# Patient Record
Sex: Female | Born: 1990 | Race: White | Hispanic: No | Marital: Married | Smoking: Current every day smoker
Health system: Southern US, Community
[De-identification: ages and names within clinical notes are randomized; demographics above are authoritative.]

## PROBLEM LIST (undated history)

## (undated) DIAGNOSIS — J45909 Unspecified asthma, uncomplicated: Secondary | ICD-10-CM

## (undated) DIAGNOSIS — E119 Type 2 diabetes mellitus without complications: Secondary | ICD-10-CM

## (undated) DIAGNOSIS — F419 Anxiety disorder, unspecified: Secondary | ICD-10-CM

## (undated) DIAGNOSIS — F4310 Post-traumatic stress disorder, unspecified: Secondary | ICD-10-CM

## (undated) HISTORY — PX: DILATION AND CURETTAGE OF UTERUS: SHX78

## (undated) HISTORY — PX: APPENDECTOMY: SHX54

## (undated) HISTORY — PX: OTHER SURGICAL HISTORY: SHX169

---

## 2015-02-22 ENCOUNTER — Emergency Department (HOSPITAL_COMMUNITY)
Admission: EM | Admit: 2015-02-22 | Discharge: 2015-02-22 | Disposition: A | Discharge status: Home / Self Care | Attending: Emergency Medicine | Admitting: Emergency Medicine

## 2015-02-22 ENCOUNTER — Encounter (HOSPITAL_COMMUNITY): Payer: Self-pay | Admitting: Emergency Medicine

## 2015-02-22 DIAGNOSIS — Z880 Allergy status to penicillin: Secondary | ICD-10-CM | POA: Insufficient documentation

## 2015-02-22 DIAGNOSIS — R079 Chest pain, unspecified: Secondary | ICD-10-CM | POA: Diagnosis not present

## 2015-02-22 DIAGNOSIS — Z794 Long term (current) use of insulin: Secondary | ICD-10-CM | POA: Diagnosis not present

## 2015-02-22 DIAGNOSIS — Z79899 Other long term (current) drug therapy: Secondary | ICD-10-CM | POA: Insufficient documentation

## 2015-02-22 DIAGNOSIS — R55 Syncope and collapse: Secondary | ICD-10-CM | POA: Insufficient documentation

## 2015-02-22 DIAGNOSIS — R112 Nausea with vomiting, unspecified: Secondary | ICD-10-CM | POA: Diagnosis not present

## 2015-02-22 DIAGNOSIS — R42 Dizziness and giddiness: Secondary | ICD-10-CM | POA: Diagnosis present

## 2015-02-22 DIAGNOSIS — E119 Type 2 diabetes mellitus without complications: Secondary | ICD-10-CM | POA: Diagnosis not present

## 2015-02-22 DIAGNOSIS — R0602 Shortness of breath: Secondary | ICD-10-CM | POA: Insufficient documentation

## 2015-02-22 HISTORY — DX: Type 2 diabetes mellitus without complications: E11.9

## 2015-02-22 LAB — COMPREHENSIVE METABOLIC PANEL
ALBUMIN: 4.1 g/dL (ref 3.5–5.0)
ALK PHOS: 77 U/L (ref 38–126)
ALT: 17 U/L (ref 14–54)
ANION GAP: 8 (ref 5–15)
AST: 22 U/L (ref 15–41)
BUN: 13 mg/dL (ref 6–20)
CALCIUM: 9.1 mg/dL (ref 8.9–10.3)
CO2: 26 mmol/L (ref 22–32)
CREATININE: 0.66 mg/dL (ref 0.44–1.00)
Chloride: 104 mmol/L (ref 101–111)
GFR calc Af Amer: >60 mL/min (ref >60)
GFR calc non Af Amer: >60 mL/min (ref >60)
Glucose, Bld: 169 mg/dL — ABNORMAL HIGH (ref 65–99)
Potassium: 3.4 mmol/L — ABNORMAL LOW (ref 3.5–5.1)
Sodium: 138 mmol/L (ref 135–145)
Total Bilirubin: 0.5 mg/dL (ref 0.3–1.2)
Total Protein: 7.6 g/dL (ref 6.5–8.1)

## 2015-02-22 LAB — CBC WITH DIFFERENTIAL/PLATELET
BASOS ABS: 0 10*3/uL (ref 0.0–0.1)
Basophils Relative: 0 % (ref 0–1)
Eosinophils Absolute: 0 10*3/uL (ref 0.0–0.7)
Eosinophils Relative: 1 % (ref 0–5)
HCT: 39.4 % (ref 36.0–46.0)
Hemoglobin: 12.3 g/dL (ref 12.0–15.0)
Lymphocytes Relative: 27 % (ref 12–46)
Lymphs Abs: 2.1 10*3/uL (ref 0.7–4.0)
MCH: 24.9 pg — AB (ref 26.0–34.0)
MCHC: 31.2 g/dL (ref 30.0–36.0)
MCV: 79.9 fL (ref 78.0–100.0)
MONOS PCT: 7 % (ref 3–12)
Monocytes Absolute: 0.6 10*3/uL (ref 0.1–1.0)
Neutro Abs: 5.2 10*3/uL (ref 1.7–7.7)
Neutrophils Relative %: 65 % (ref 43–77)
Platelets: 279 10*3/uL (ref 150–400)
RBC: 4.93 MIL/uL (ref 3.87–5.11)
RDW: 13.6 % (ref 11.5–15.5)
WBC: 8 10*3/uL (ref 4.0–10.5)

## 2015-02-22 LAB — RAPID URINE DRUG SCREEN, HOSP PERFORMED
Amphetamines: NOT DETECTED
BARBITURATES: NOT DETECTED
Benzodiazepines: NOT DETECTED
Cocaine: NOT DETECTED
OPIATES: NOT DETECTED
TETRAHYDROCANNABINOL: NOT DETECTED

## 2015-02-22 LAB — URINALYSIS, ROUTINE W REFLEX MICROSCOPIC
BILIRUBIN URINE: NEGATIVE
Glucose, UA: NEGATIVE mg/dL
Hgb urine dipstick: NEGATIVE
KETONES UR: NEGATIVE mg/dL
LEUKOCYTES UA: NEGATIVE
Nitrite: NEGATIVE
PH: 7 (ref 5.0–8.0)
PROTEIN: NEGATIVE mg/dL
Specific Gravity, Urine: 1.014 (ref 1.005–1.030)
UROBILINOGEN UA: 0.2 mg/dL (ref 0.0–1.0)

## 2015-02-22 LAB — CBG MONITORING, ED: GLUCOSE-CAPILLARY: 152 mg/dL — AB (ref 65–99)

## 2015-02-22 LAB — LIPASE, BLOOD: LIPASE: 11 U/L — AB (ref 22–51)

## 2015-02-22 LAB — PREGNANCY, URINE: Preg Test, Ur: NEGATIVE

## 2015-02-22 MED ORDER — SODIUM CHLORIDE 0.9 % IV BOLUS (SEPSIS)
1000.0000 mL | Freq: Once | INTRAVENOUS | Status: AC
  Administered 2015-02-22: 1000 mL via INTRAVENOUS

## 2015-02-22 MED ORDER — ONDANSETRON HCL 4 MG/2ML IJ SOLN
4.0000 mg | Freq: Once | INTRAMUSCULAR | Status: AC
  Administered 2015-02-22: 4 mg via INTRAVENOUS
  Filled 2015-02-22: qty 2

## 2015-02-22 MED ORDER — ONDANSETRON 4 MG PO TBDP: 4.0000 mg | ORAL_TABLET | Freq: Three times a day (TID) | ORAL | Status: DC | PRN

## 2015-02-22 NOTE — ED Notes (Signed)
Pt ambulate in the hallway with no assistance

## 2015-02-22 NOTE — Discharge Instructions (Signed)
Please follow up with your primary care physician in 1-2 days. If you do not have one please [facility] number listed above. Please read all discharge instructions and return precautions.    Near-Syncope Near-syncope (commonly known as near fainting) is sudden weakness, dizziness, or feeling like you might pass out. During an episode of near-syncope, you may also develop pale skin, have tunnel vision, or feel sick to your stomach (nauseous). Near-syncope may occur when getting up after sitting or while standing for a long time. It is caused by a sudden decrease in blood flow to the brain. This decrease can result from various causes or triggers, most of which are not serious. However, because near-syncope can sometimes be a sign of something serious, a medical evaluation is required. The specific cause is often not determined. HOME CARE INSTRUCTIONS  Monitor your condition for any changes. The following actions may help to alleviate any discomfort you are experiencing:  Have someone stay with you until you feel stable.  Lie down right away and prop your feet up if you start feeling like you might faint. Breathe deeply and steadily. Wait until all the symptoms have passed. Most of these episodes last only a few minutes. You may feel tired for several hours.   Drink enough fluids to keep your urine clear or pale yellow.   If you are taking blood pressure or heart medicine, get up slowly when seated or lying down. Take several minutes to sit and then stand. This can reduce dizziness.  Follow up with your health care provider as directed. SEEK IMMEDIATE MEDICAL CARE IF:   You have a severe headache.   You have unusual pain in the chest, abdomen, or back.   You are bleeding from the mouth or rectum, or you have black or tarry stool.   You have an irregular or very fast heartbeat.   You have repeated fainting or have seizure-like jerking during an episode.    You faint when sitting or lying down.   You have confusion.   You have difficulty walking.   You have severe weakness.   You have vision problems.  MAKE SURE YOU:   Understand these instructions.  Will watch your condition.  Will get help right away if you are not doing well or get worse. Document Released: 07/24/2005 Document Revised: 07/29/2013 Document Reviewed: 12/27/2012 [facility] Patient Information 2015 [employer] [place]. This information is not intended to replace advice given to you by your health care provider. Make sure you discuss any questions you have with your health care provider.

## 2015-02-22 NOTE — ED Notes (Addendum)
Pt states that she has had n/v, dizziness and near syncope today. States she is a type 1 diabetic. Sugar at home was 180. Alert and oriented.

## 2015-02-22 NOTE — ED Provider Notes (Signed)
CSN: [id number]     Arrival date & time 02/22/15  0008 History   First MD Initiated Contact with Patient 02/22/15 0010     Chief Complaint  Patient presents with  . Dizziness     (Consider location/radiation/quality/duration/timing/severity/associated sxs/prior Treatment) HPI Comments: Patient is a 24 yo F PMHx significant for DM presenting to the ED for dizziness and lightheadedness that began at 8:30PM this evening. She states she became near syncopal when getting out of the car. She endorses nausea, vomiting x 1 (non-bloody non-bilious), CP and SOB. No modifying factors identified. PERC negative. No cardiac problems.   Patient is a 24 y.o. female presenting with dizziness.  Dizziness Quality:  Lightheadedness, imbalance and head spinning Onset quality:  Sudden Duration:  3 hours Timing:  Constant Chronicity:  New Relieved by:  None tried Worsened by:  Nothing Ineffective treatments:  None tried Associated symptoms: chest pain, nausea, shortness of breath and vomiting   Associated symptoms: no syncope   Risk factors: no heart disease and no hx of stroke     Past Medical History  Diagnosis Date  . Diabetes mellitus    No past surgical history on file. History reviewed. No pertinent family history. History  Substance Use Topics  . Smoking status: Not on file  . Smokeless tobacco: Not on file  . Alcohol Use: Not on file   OB History    No data available     Review of Systems  Respiratory: Positive for shortness of breath.   Cardiovascular: Positive for chest pain. Negative for syncope.  Gastrointestinal: Positive for nausea and vomiting.  Neurological: Positive for dizziness.  All other systems reviewed and are negative.     Allergies  Amoxicillin; Cleocin; Sulfa antibiotics; Vancomycin; and Zithromax  Home Medications   Prior to Admission medications   Medication Sig Start Date End Date Taking? Authorizing Provider  insulin glargine (LANTUS) 100 UNIT/ML  injection Inject 15 Units into the skin 2 (two) times daily.   Yes Historical Provider, MD  insulin lispro (HUMALOG) 100 UNIT/ML injection Inject 5-7 Units into the skin 3 (three) times daily before meals. Sliding scale= base of 5 units and increase by 1 unit for every 50. (150-200=6 units and 200-250=7 units)   Yes Historical Provider, MD  Multiple Vitamin (MULTIVITAMIN WITH MINERALS) TABS tablet Take 1 tablet by mouth daily.   Yes Historical Provider, MD  ondansetron (ZOFRAN ODT) 4 MG disintegrating tablet Take 1 tablet (4 mg total) by mouth every 8 (eight) hours as needed for nausea. 02/22/15   Inza Mikrut, PA-C   BP 94/63 mmHg  Pulse 66  Temp(Src) 98 F (36.7 C) (Oral)  Resp 16  SpO2 100%  LMP 02/01/2015 (Approximate) Physical Exam  Constitutional: She is oriented to person, place, and time. She appears well-developed and well-nourished. No distress.  HENT:  Head: Normocephalic and atraumatic.  Right Ear: External ear normal.  Left Ear: External ear normal.  Nose: Nose normal.  Mouth/Throat: Oropharynx is clear and moist. No oropharyngeal exudate.  Eyes: Conjunctivae and EOM are normal. Pupils are equal, round, and reactive to light.  Neck: Normal range of motion. Neck supple.  Cardiovascular: Normal rate, regular rhythm, normal heart sounds and intact distal pulses.   Pulmonary/Chest: Effort normal and breath sounds normal. No respiratory distress.  Abdominal: Soft. There is no tenderness.  Neurological: She is alert and oriented to person, place, and time. She has normal strength. No cranial nerve deficit. GCS eye subscore is 4. GCS verbal subscore is  5. GCS motor subscore is 6.  Sensation grossly intact.  No pronator drift.  Bilateral heel-knee-shin intact.  Skin: Skin is warm and dry. She is not diaphoretic.  Nursing note and vitals reviewed.   ED Course  Procedures (including critical care time) Medications  sodium chloride 0.9 % bolus 1,000 mL (0 mLs Intravenous  Stopped 02/22/15 0138)  ondansetron (ZOFRAN) injection 4 mg (4 mg Intravenous Given 02/22/15 0038)  sodium chloride 0.9 % bolus 1,000 mL (0 mLs Intravenous Stopped 02/22/15 0308)    Labs Review Labs Reviewed  CBC WITH DIFFERENTIAL/PLATELET - Abnormal; Notable for the following:    MCH 24.9 (*)    All other components within normal limits  COMPREHENSIVE METABOLIC PANEL - Abnormal; Notable for the following:    Potassium 3.4 (*)    Glucose, Bld 169 (*)    All other components within normal limits  LIPASE, BLOOD - Abnormal; Notable for the following:    Lipase 11 (*)    All other components within normal limits  CBG MONITORING, ED - Abnormal; Notable for the following:    Glucose-Capillary 152 (*)    All other components within normal limits  PREGNANCY, URINE  URINALYSIS, ROUTINE W REFLEX MICROSCOPIC (NOT AT [facility])  URINE RAPID DRUG SCREEN, HOSP PERFORMED    Imaging Review No results found.   EKG Interpretation   Date/Time:  Monday February 22 2015 00:31:17 EDT Ventricular Rate:  91 PR Interval:  135 QRS Duration: 96 QT Interval:  379 QTC Calculation: 466 R Axis:   60 Text Interpretation:  Sinus rhythm Normal ECG Confirmed by DELO  MD,  DOUGLAS ([id number]) on 02/22/2015 12:33:57 AM      2:39 AM Patient ambulating without difficulty.   MDM   Final diagnoses:  Near syncope    Filed Vitals:   02/22/15 0309  BP: 94/63  Pulse: 66  Temp:   Resp: 16   Afebrile, NAD, non-toxic appearing, AAOx4.   I have reviewed nursing notes, vital signs, and all lab as noted above.   Patient presenting for multiple complaints this evening. On examination she is uncomfortable appearing. There are no neurofocal deficits on examination. Abdomen is soft, nontender, nondistended. No nuchal rigidity or toxicities to suggest meningitis. We'll obtain basic screening labs and UA. Will give IV fluids and nausea medication. EKG is unremarkable. Labs and UA within normal limits. No evidence of infection.  No orthostasis noted. Patient is able to ambulate in the emergency department after IV fluids with no difficulty. She is symptom-free. Able to tolerate by mouth intake. Will discharge home with good return precautions given.   Francee PiccoloJennifer Narjis Mira, PA-C 02/22/15 [id number]  Geoffery Lyonsouglas Delo, MD 02/22/15 [phone]

## 2015-05-12 ENCOUNTER — Emergency Department (HOSPITAL_COMMUNITY)
Admission: EM | Admit: 2015-05-12 | Discharge: 2015-05-12 | Disposition: A | Discharge status: Home / Self Care | Attending: Emergency Medicine | Admitting: Emergency Medicine

## 2015-05-12 ENCOUNTER — Emergency Department (HOSPITAL_COMMUNITY)

## 2015-05-12 ENCOUNTER — Encounter (HOSPITAL_COMMUNITY): Payer: Self-pay

## 2015-05-12 DIAGNOSIS — E119 Type 2 diabetes mellitus without complications: Secondary | ICD-10-CM | POA: Insufficient documentation

## 2015-05-12 DIAGNOSIS — R739 Hyperglycemia, unspecified: Secondary | ICD-10-CM

## 2015-05-12 DIAGNOSIS — J40 Bronchitis, not specified as acute or chronic: Secondary | ICD-10-CM

## 2015-05-12 DIAGNOSIS — Z8659 Personal history of other mental and behavioral disorders: Secondary | ICD-10-CM | POA: Insufficient documentation

## 2015-05-12 DIAGNOSIS — Z794 Long term (current) use of insulin: Secondary | ICD-10-CM | POA: Insufficient documentation

## 2015-05-12 DIAGNOSIS — J45901 Unspecified asthma with (acute) exacerbation: Secondary | ICD-10-CM | POA: Insufficient documentation

## 2015-05-12 DIAGNOSIS — Z720 Tobacco use: Secondary | ICD-10-CM | POA: Insufficient documentation

## 2015-05-12 DIAGNOSIS — Z880 Allergy status to penicillin: Secondary | ICD-10-CM | POA: Insufficient documentation

## 2015-05-12 DIAGNOSIS — Z79899 Other long term (current) drug therapy: Secondary | ICD-10-CM | POA: Insufficient documentation

## 2015-05-12 DIAGNOSIS — R61 Generalized hyperhidrosis: Secondary | ICD-10-CM | POA: Insufficient documentation

## 2015-05-12 HISTORY — DX: Unspecified asthma, uncomplicated: J45.909

## 2015-05-12 HISTORY — DX: Anxiety disorder, unspecified: F41.9

## 2015-05-12 HISTORY — DX: Post-traumatic stress disorder, unspecified: F43.10

## 2015-05-12 LAB — CBC WITH DIFFERENTIAL/PLATELET
Basophils Absolute: 0 10*3/uL (ref 0.0–0.1)
Basophils Relative: 0 %
Eosinophils Absolute: 0.1 10*3/uL (ref 0.0–0.7)
Eosinophils Relative: 1 %
HEMATOCRIT: 39 % (ref 36.0–46.0)
HEMOGLOBIN: 12.4 g/dL (ref 12.0–15.0)
LYMPHS ABS: 2 10*3/uL (ref 0.7–4.0)
Lymphocytes Relative: 18 %
MCH: 25.3 pg — AB (ref 26.0–34.0)
MCHC: 31.8 g/dL (ref 30.0–36.0)
MCV: 79.6 fL (ref 78.0–100.0)
MONOS PCT: 7 %
Monocytes Absolute: 0.8 10*3/uL (ref 0.1–1.0)
NEUTROS ABS: 8.3 10*3/uL — AB (ref 1.7–7.7)
NEUTROS PCT: 74 %
Platelets: 200 10*3/uL (ref 150–400)
RBC: 4.9 MIL/uL (ref 3.87–5.11)
RDW: 15.1 % (ref 11.5–15.5)
WBC: 11.1 10*3/uL — AB (ref 4.0–10.5)

## 2015-05-12 LAB — BLOOD GAS, VENOUS
ACID-BASE DEFICIT: 4 mmol/L — AB (ref 0.0–2.0)
Bicarbonate: 21.2 mEq/L (ref 20.0–24.0)
O2 SAT: 69.5 %
PCO2 VEN: 41.2 mmHg — AB (ref 45.0–50.0)
PH VEN: 7.331 — AB (ref 7.250–7.300)
PO2 VEN: 42 mmHg (ref 30.0–45.0)
Patient temperature: 98.6
TCO2: 19.6 mmol/L (ref 0–100)

## 2015-05-12 LAB — URINALYSIS, ROUTINE W REFLEX MICROSCOPIC
BILIRUBIN URINE: NEGATIVE
HGB URINE DIPSTICK: NEGATIVE
Ketones, ur: 15 mg/dL — AB
Leukocytes, UA: NEGATIVE
Nitrite: NEGATIVE
Protein, ur: NEGATIVE mg/dL
SPECIFIC GRAVITY, URINE: 1.03 (ref 1.005–1.030)
UROBILINOGEN UA: 1 mg/dL (ref 0.0–1.0)
pH: 6.5 (ref 5.0–8.0)

## 2015-05-12 LAB — BASIC METABOLIC PANEL
ANION GAP: 12 (ref 5–15)
BUN: 17 mg/dL (ref 6–20)
CHLORIDE: 96 mmol/L — AB (ref 101–111)
CO2: 21 mmol/L — AB (ref 22–32)
CREATININE: 0.9 mg/dL (ref 0.44–1.00)
Calcium: 9 mg/dL (ref 8.9–10.3)
GFR calc Af Amer: >60 mL/min (ref >60)
GFR calc non Af Amer: >60 mL/min (ref >60)
Glucose, Bld: 768 mg/dL (ref 65–99)
POTASSIUM: 4.9 mmol/L (ref 3.5–5.1)
SODIUM: 129 mmol/L — AB (ref 135–145)

## 2015-05-12 LAB — CBG MONITORING, ED
GLUCOSE-CAPILLARY: 183 mg/dL — AB (ref 65–99)
Glucose-Capillary: 399 mg/dL — ABNORMAL HIGH (ref 65–99)
Glucose-Capillary: 273 mg/dL — ABNORMAL HIGH (ref 65–99)

## 2015-05-12 LAB — URINE MICROSCOPIC-ADD ON

## 2015-05-12 MED ORDER — SODIUM CHLORIDE 0.9 % IV SOLN: Freq: Once | INTRAVENOUS | Status: AC

## 2015-05-12 MED ORDER — SODIUM CHLORIDE 0.9 % IV SOLN
INTRAVENOUS | Status: DC
  Administered 2015-05-12: 3.4 [IU]/h via INTRAVENOUS
  Filled 2015-05-12: qty 2.5

## 2015-05-12 MED ORDER — SODIUM CHLORIDE 0.9 % IV BOLUS (SEPSIS)
1000.0000 mL | Freq: Once | INTRAVENOUS | Status: AC
  Administered 2015-05-12: 1000 mL via INTRAVENOUS

## 2015-05-12 MED ORDER — BENZONATATE 100 MG PO CAPS: 100.0000 mg | ORAL_CAPSULE | Freq: Three times a day (TID) | ORAL | Status: DC | PRN

## 2015-05-12 MED ORDER — DEXTROSE-NACL 5-0.45 % IV SOLN: INTRAVENOUS | Status: DC

## 2015-05-12 NOTE — ED Notes (Signed)
Glucostabilizer discontinued per verbal order Dr. Criss Alvine.

## 2015-05-12 NOTE — ED Notes (Signed)
Dr. Criss Alvine informed pt's CBG 399.

## 2015-05-12 NOTE — ED Notes (Signed)
Patient states she developed a cough 2 weeks ago. Patient has been taking Dayquil, Nyquil, and Mucinex with very little relief. Patient presents today with expiratory wheezing, a productive cough with clear sputum, and SOB. Patient has a history of asthma

## 2015-05-12 NOTE — ED Notes (Signed)
Observed patient injecting Lantus insulin through her jeans. When questioned about this, patient stated, "yeah, I know."

## 2015-05-12 NOTE — Progress Notes (Signed)
CM spoke with pt who confirms uninsured [employer] resident with no pcp. Pt with 2 ED visits in the last 6 months no admissions CM discussed and provided written information for uninsured accepting pcps, discussed the importance of pcp vs EDP services for f/u care, www.needymeds.org, www.goodrx.com, discounted pharmacies and other [employer] such as [facility] , [employer], affordable care act, financial assistance, uninsured dental services, [place] med assist, DSS and  health department  Reviewed resources for [employer] uninsured accepting pcps like [facility], family medicine at [facility], [facility], [facility], local urgent care centers, [facility], [facility] family practice, general medical clinics, family services of the [place], [facility] urgent care plus others, medication resources, [facility] out patient pharmacies and housing Pt voiced understanding and appreciation of resources provided   Provided [facility] contact information Pt did not agreed to a referral States she is not sure if she will have coverage soon and wants to contact them herself if needed

## 2015-05-12 NOTE — ED Provider Notes (Signed)
CSN: [id number]     Arrival date & time 05/12/15  7829 History   First MD Initiated Contact with Patient 05/12/15 1003     Chief Complaint  Patient presents with  . Cough  . Shortness of Breath  . Wheezing     (Consider location/radiation/quality/duration/timing/severity/associated sxs/prior Treatment) HPI  24 year old female presents with a cough that has been ongoing for the past 2 weeks. Patient has tried DayQuil, NyQuil, and Mucinex, minimal to no relief. Patient states that over the last 5 days the cough is been worse and has clear sputum. Intermittent shortness of breath. Patient has been hearing expiratory wheezes as well. Has a history of type 1 diabetes. Used to have asthma as a child, no history of asthma in over 10 years.. She does smoke. Has been feeling chest tightness and occasionally pressure.  Past Medical History  Diagnosis Date  . Diabetes mellitus (HCC)   . Asthma   . Anxiety   . PTSD (post-traumatic stress disorder)    Past Surgical History  Procedure Laterality Date  . Cesarean section      x 2.  . Appendectomy    . Dilation and curettage of uterus     Family History  Problem Relation Age of Onset  . Hypertension Mother   . GER disease Father   . Asthma Brother    Social History  Substance Use Topics  . Smoking status: Current Every Day Smoker -- 0.15 packs/day for 8 years    Types: Cigarettes  . Smokeless tobacco: Never Used  . Alcohol Use: Yes     Comment: occasionally   OB History    No data available     Review of Systems  Constitutional: Positive for diaphoresis. Negative for fever.  HENT: Positive for rhinorrhea.   Respiratory: Positive for cough, chest tightness, shortness of breath and wheezing.   Cardiovascular: Positive for chest pain.  Gastrointestinal: Negative for vomiting.  All other systems reviewed and are negative.     Allergies  Amoxicillin; Cleocin; Sulfa antibiotics; Vancomycin; and Zithromax  Home Medications    Prior to Admission medications   Medication Sig Start Date End Date Taking? Authorizing Provider  Caffeine-Magnesium Salicylate (DIUREX PO) Take 1 capsule by mouth daily as needed (for retaining water).   Yes Historical Provider, MD  DTaP-hepatitis B recombinant-IPV (PEDIARIX) injection Inject 0.5 mLs into the muscle once.   Yes Historical Provider, MD  ferrous sulfate 325 (65 FE) MG tablet Take 325 mg by mouth daily with breakfast.   Yes Historical Provider, MD  insulin glargine (LANTUS) 100 UNIT/ML injection Inject 15 Units into the skin 2 (two) times daily.   Yes Historical Provider, MD  insulin lispro (HUMALOG) 100 UNIT/ML injection Inject 5-7 Units into the skin 3 (three) times daily before meals. Sliding scale= base of 5 units and increase by 1 unit for every 50. (150-200=6 units and 200-250=7 units)   Yes Historical Provider, MD  Multiple Vitamin (MULTIVITAMIN WITH MINERALS) TABS tablet Take 1 tablet by mouth daily.   Yes Historical Provider, MD  ondansetron (ZOFRAN-ODT) 8 MG disintegrating tablet Take 8 mg by mouth every 8 (eight) hours as needed for nausea or vomiting.   Yes Historical Provider, MD  pyridOXINE (B-6) 50 MG tablet Take 50 mg by mouth daily.   Yes Historical Provider, MD  simethicone (MYLICON) 80 MG chewable tablet Chew 80 mg by mouth 3 (three) times daily as needed for flatulence.   Yes Historical Provider, MD  vitamin C (ASCORBIC ACID) 500  MG tablet Take 500 mg by mouth daily.   Yes Historical Provider, MD  ondansetron (ZOFRAN ODT) 4 MG disintegrating tablet Take 1 tablet (4 mg total) by mouth every 8 (eight) hours as needed for nausea. Patient not taking: Reported on 05/12/2015 02/22/15   Francee Piccolo, PA-C   BP 112/77 mmHg  Pulse 112  Temp(Src) 98.1 F (36.7 C) (Oral)  Resp 16  Ht  (1.549 m)  Wt 130 lb (58.968 kg)  BMI 24.58 kg/m2  SpO2 98%  LMP 05/05/2015 Physical Exam  Constitutional: She is oriented to person, place, and time. She appears  well-developed and well-nourished. No distress.  HENT:  Head: Normocephalic and atraumatic.  Right Ear: External ear normal.  Left Ear: External ear normal.  Nose: Nose normal.  Eyes: Right eye exhibits no discharge. Left eye exhibits no discharge.  Cardiovascular: Normal rate, regular rhythm and normal heart sounds.   HR high 90s  Pulmonary/Chest: Effort normal and breath sounds normal. She has no wheezes. She has no rales.  Abdominal: Soft. She exhibits no distension. There is no tenderness.  Neurological: She is alert and oriented to person, place, and time.  Skin: Skin is warm and dry. She is not diaphoretic.  Nursing note and vitals reviewed.   ED Course  Procedures (including critical care time) Labs Review Labs Reviewed  BASIC METABOLIC PANEL - Abnormal; Notable for the following:    Sodium 129 (*)    Chloride 96 (*)    CO2 21 (*)    Glucose, Bld 768 (*)    All other components within normal limits  CBC WITH DIFFERENTIAL/PLATELET - Abnormal; Notable for the following:    WBC 11.1 (*)    MCH 25.3 (*)    Neutro Abs 8.3 (*)    All other components within normal limits  URINALYSIS, ROUTINE W REFLEX MICROSCOPIC (NOT AT [facility]) - Abnormal; Notable for the following:    Glucose, UA >1000 (*)    Ketones, ur 15 (*)    All other components within normal limits  BLOOD GAS, VENOUS - Abnormal; Notable for the following:    pH, Ven 7.331 (*)    pCO2, Ven 41.2 (*)    Acid-base deficit 4.0 (*)    All other components within normal limits  CBG MONITORING, ED - Abnormal; Notable for the following:    Glucose-Capillary 399 (*)    All other components within normal limits  CBG MONITORING, ED - Abnormal; Notable for the following:    Glucose-Capillary 273 (*)    All other components within normal limits  URINE MICROSCOPIC-ADD ON  CBG MONITORING, ED    Imaging Review Dg Chest 2 View  05/12/2015   CLINICAL DATA:  Cough  EXAM: CHEST  2 VIEW  COMPARISON:  None.  FINDINGS: The heart  size and mediastinal contours are within normal limits. Both lungs are clear. The visualized skeletal structures are unremarkable.  IMPRESSION: No active cardiopulmonary disease.   Electronically Signed   By: Marlan Palau M.D.   On: 05/12/2015 12:00   I have personally reviewed and evaluated these images and lab results as part of my medical decision-making.   EKG Interpretation   Date/Time:  Wednesday May 12 2015 10:43:59 EDT Ventricular Rate:  84 PR Interval:  133 QRS Duration: 101 QT Interval:  369 QTC Calculation: 436 R Axis:   75 Text Interpretation:  Sinus rhythm Low voltage, precordial leads RSR' in  V1 or V2, right VCD or RVH Baseline wander in lead(s) I  III aVL no  significant change since July 2016 Confirmed by Criss Alvine  MD, Nicolae Vasek [phone])  on 05/12/2015 10:50:19 AM      MDM   Final diagnoses:  Bronchitis  Hyperglycemia    Given symptoms she most likely has bronchitis/viral URI. Could be flu but out of the window for tamiflu treatment given length of symptoms. No recent history of RAD, and with significant hyperglycemia I think steroids would be more harmful than beneficial. Given fluids and insulin to help with her glucose. No definitive signs of DKA (normal AG, no significant acidosis).  Discussed strict return precautions.    Pricilla Loveless, MD 05/12/15 [phone]

## 2015-05-27 ENCOUNTER — Emergency Department (HOSPITAL_COMMUNITY)
Admission: EM | Admit: 2015-05-27 | Discharge: 2015-05-27 | Discharge status: Against Medical Advice | Attending: Emergency Medicine | Admitting: Emergency Medicine

## 2015-05-27 ENCOUNTER — Encounter (HOSPITAL_COMMUNITY): Payer: Self-pay | Admitting: Emergency Medicine

## 2015-05-27 DIAGNOSIS — E119 Type 2 diabetes mellitus without complications: Secondary | ICD-10-CM | POA: Insufficient documentation

## 2015-05-27 DIAGNOSIS — Y998 Other external cause status: Secondary | ICD-10-CM | POA: Insufficient documentation

## 2015-05-27 DIAGNOSIS — J45909 Unspecified asthma, uncomplicated: Secondary | ICD-10-CM | POA: Insufficient documentation

## 2015-05-27 DIAGNOSIS — T189XXA Foreign body of alimentary tract, part unspecified, initial encounter: Secondary | ICD-10-CM | POA: Insufficient documentation

## 2015-05-27 DIAGNOSIS — Z720 Tobacco use: Secondary | ICD-10-CM | POA: Insufficient documentation

## 2015-05-27 DIAGNOSIS — X58XXXA Exposure to other specified factors, initial encounter: Secondary | ICD-10-CM | POA: Insufficient documentation

## 2015-05-27 DIAGNOSIS — Y9289 Other specified places as the place of occurrence of the external cause: Secondary | ICD-10-CM | POA: Insufficient documentation

## 2015-05-27 DIAGNOSIS — Y9389 Activity, other specified: Secondary | ICD-10-CM | POA: Insufficient documentation

## 2015-05-27 NOTE — ED Notes (Signed)
Pt told Registration that she wanted to leave.

## 2015-05-27 NOTE — ED Notes (Signed)
Per pt, states she inhaled cap of pen-now coughed it up but having throat pain

## 2015-11-17 ENCOUNTER — Encounter (HOSPITAL_COMMUNITY): Payer: Self-pay | Admitting: Emergency Medicine

## 2015-11-17 ENCOUNTER — Emergency Department (HOSPITAL_COMMUNITY): Payer: Medicaid Other

## 2015-11-17 ENCOUNTER — Emergency Department (HOSPITAL_COMMUNITY)
Admission: EM | Admit: 2015-11-17 | Discharge: 2015-11-17 | Disposition: A | Discharge status: Home / Self Care | Payer: Medicaid Other | Attending: Emergency Medicine | Admitting: Emergency Medicine

## 2015-11-17 DIAGNOSIS — Z8659 Personal history of other mental and behavioral disorders: Secondary | ICD-10-CM | POA: Diagnosis not present

## 2015-11-17 DIAGNOSIS — J45909 Unspecified asthma, uncomplicated: Secondary | ICD-10-CM | POA: Insufficient documentation

## 2015-11-17 DIAGNOSIS — Z79899 Other long term (current) drug therapy: Secondary | ICD-10-CM | POA: Diagnosis not present

## 2015-11-17 DIAGNOSIS — M791 Myalgia: Secondary | ICD-10-CM | POA: Insufficient documentation

## 2015-11-17 DIAGNOSIS — R059 Cough, unspecified: Secondary | ICD-10-CM

## 2015-11-17 DIAGNOSIS — Z880 Allergy status to penicillin: Secondary | ICD-10-CM | POA: Diagnosis not present

## 2015-11-17 DIAGNOSIS — Z794 Long term (current) use of insulin: Secondary | ICD-10-CM | POA: Diagnosis not present

## 2015-11-17 DIAGNOSIS — F17210 Nicotine dependence, cigarettes, uncomplicated: Secondary | ICD-10-CM | POA: Insufficient documentation

## 2015-11-17 DIAGNOSIS — R05 Cough: Secondary | ICD-10-CM

## 2015-11-17 DIAGNOSIS — R110 Nausea: Secondary | ICD-10-CM | POA: Insufficient documentation

## 2015-11-17 DIAGNOSIS — E119 Type 2 diabetes mellitus without complications: Secondary | ICD-10-CM | POA: Diagnosis not present

## 2015-11-17 MED ORDER — BENZONATATE 100 MG PO CAPS: 100.0000 mg | ORAL_CAPSULE | Freq: Three times a day (TID) | ORAL | Status: DC

## 2015-11-17 MED ORDER — CETIRIZINE HCL 10 MG PO TABS: 10.0000 mg | ORAL_TABLET | Freq: Every day | ORAL | Status: DC

## 2015-11-17 NOTE — Discharge Instructions (Signed)
Take zyrtec for allergies.  Take tessalon to help with cough.  Neither of these should make you drowsy/sleepy. Follow-up with your primary care physician. Return to the ED for new or worsening symptoms.

## 2015-11-17 NOTE — ED Provider Notes (Signed)
CSN: [id number]     Arrival date & time 11/17/15  1115 History   First MD Initiated Contact with Patient 11/17/15 1120     Chief Complaint  Patient presents with  . flu like symptoms      (Consider location/radiation/quality/duration/timing/severity/associated sxs/prior Treatment) The history is provided by the patient and medical records.    25 year old female with history of diabetes, asthma, anxiety, PTSD, presenting to the ED for cough for the past 3 weeks. She states cough has been intermittently productive with frothy, white sputum, which she thought was due to her allergies.  She states she hadn't been running a fever until last night when she spiked a temp of 100.33F but went back to normal this morning without any medications.  She also now has some body aches.  She states her girlfriend was recently sick with similar symptoms, unsure if she pick this up from her. She is also had some sick contacts at school. States she has had some mild nausea and has not felt like eating or drinking very much recently. Patient is a type I diabetic, checked her sugar this morning it was 100. Her diabetes is well controlled on her home medications.  She did take some motrin this morning for bodyaches but states she still feels "sore".  No chest pain, SOB, sore throat, ear pain, vomiting, diarrhea, abdominal pain, or urinary symptoms.  Past Medical History  Diagnosis Date  . Diabetes mellitus (HCC)   . Asthma   . Anxiety   . PTSD (post-traumatic stress disorder)    Past Surgical History  Procedure Laterality Date  . Cesarean section      x 2.  . Appendectomy    . Dilation and curettage of uterus     Family History  Problem Relation Age of Onset  . Hypertension Mother   . GER disease Father   . Asthma Brother    Social History  Substance Use Topics  . Smoking status: Current Every Day Smoker -- 0.15 packs/day for 8 years    Types: Cigarettes  . Smokeless tobacco: Never Used  . Alcohol  Use: Yes     Comment: occasionally   OB History    No data available     Review of Systems  Respiratory: Positive for cough.   Musculoskeletal: Positive for myalgias.  All other systems reviewed and are negative.     Allergies  Amoxicillin; Cleocin; Sulfa antibiotics; Vancomycin; and Zithromax  Home Medications   Prior to Admission medications   Medication Sig Start Date End Date Taking? Authorizing Provider  benzonatate (TESSALON PERLES) 100 MG capsule Take 1 capsule (100 mg total) by mouth 3 (three) times daily as needed for cough. 05/12/15   Pricilla Loveless, MD  Caffeine-Magnesium Salicylate (DIUREX PO) Take 1 capsule by mouth daily as needed (for retaining water).    Historical Provider, MD  DTaP-hepatitis B recombinant-IPV (PEDIARIX) injection Inject 0.5 mLs into the muscle once.    Historical Provider, MD  ferrous sulfate 325 (65 FE) MG tablet Take 325 mg by mouth daily with breakfast.    Historical Provider, MD  insulin glargine (LANTUS) 100 UNIT/ML injection Inject 15 Units into the skin 2 (two) times daily.    Historical Provider, MD  insulin lispro (HUMALOG) 100 UNIT/ML injection Inject 5-7 Units into the skin 3 (three) times daily before meals. Sliding scale= base of 5 units and increase by 1 unit for every 50. (150-200=6 units and 200-250=7 units)    Historical Provider, MD  Multiple Vitamin (MULTIVITAMIN WITH MINERALS) TABS tablet Take 1 tablet by mouth daily.    Historical Provider, MD  ondansetron (ZOFRAN ODT) 4 MG disintegrating tablet Take 1 tablet (4 mg total) by mouth every 8 (eight) hours as needed for nausea. Patient not taking: Reported on 05/12/2015 02/22/15   Francee PiccoloJennifer Piepenbrink, PA-C  ondansetron (ZOFRAN-ODT) 8 MG disintegrating tablet Take 8 mg by mouth every 8 (eight) hours as needed for nausea or vomiting.    Historical Provider, MD  pyridOXINE (B-6) 50 MG tablet Take 50 mg by mouth daily.    Historical Provider, MD  simethicone (MYLICON) 80 MG chewable  tablet Chew 80 mg by mouth 3 (three) times daily as needed for flatulence.    Historical Provider, MD  vitamin C (ASCORBIC ACID) 500 MG tablet Take 500 mg by mouth daily.    Historical Provider, MD   BP 105/79 mmHg  Pulse 95  Temp(Src) 98.6 F (37 C) (Oral)  Resp 16  SpO2 100%  LMP 10/24/2015   Physical Exam  Constitutional: She is oriented to person, place, and time. She appears well-developed and well-nourished.  Well appearing, no distress  HENT:  Head: Normocephalic and atraumatic.  Right Ear: Tympanic membrane and ear canal normal.  Left Ear: Tympanic membrane and ear canal normal.  Nose: Nose normal.  Mouth/Throat: Uvula is midline, oropharynx is clear and moist and mucous membranes are normal.  Tonsils overall normal in appearance bilaterally without exudate; uvula midline without evidence of peritonsillar abscess; handling secretions appropriately; no difficulty swallowing or speaking; normal phonation without stridor  Eyes: Conjunctivae and EOM are normal. Pupils are equal, round, and reactive to light.  Neck: Normal range of motion.  Cardiovascular: Normal rate, regular rhythm and normal heart sounds.   Pulmonary/Chest: Effort normal and breath sounds normal. No respiratory distress. She has no wheezes. She has no rhonchi. She has no rales.  No cough noted during exam, no wheezes or rhonchi, no distress  Abdominal: Soft. Bowel sounds are normal.  Musculoskeletal: Normal range of motion.  Lymphadenopathy:    She has no cervical adenopathy.  Neurological: She is alert and oriented to person, place, and time.  Skin: Skin is warm and dry.  Psychiatric: She has a normal mood and affect.  Nursing note and vitals reviewed.   ED Course  Procedures (including critical care time) Labs Review Labs Reviewed - No data to display  Imaging Review Dg Chest 2 View  11/17/2015  CLINICAL DATA:  Cough for 3-4 weeks. Fever. Lightheadedness, dizziness, and blurred vision. Smoker. EXAM:  CHEST  2 VIEW COMPARISON:  05/12/2015. FINDINGS: The heart size and mediastinal contours are within normal limits. Both lungs are clear. The visualized skeletal structures are unremarkable. IMPRESSION: No active cardiopulmonary disease.  No change from priors. Electronically Signed   By: Elsie StainJohn T Curnes M.D.   On: 11/17/2015 13:04   I have personally reviewed and evaluated these images and lab results as part of my medical decision-making.   EKG Interpretation None      MDM   Final diagnoses:  Cough   25 year old female here with intermittently productive cough for the past 3 weeks. Reports onset of low-grade fever and body aches yesterday. Patient is afebrile, nontoxic. Her exam is overall benign. She has had no active cough here in the emergency department. She has no wheezes or rhonchi. Does report sick contacts with similar symptoms. Chest x-ray was obtained given prolonged cough, no evidence of acute infiltrate or other cardiopulmonary disease. Patient discharged home  with supportive care. She does report history of allergies, recommended that she start a daily Zyrtec. Also given Tessalon for cough.  Discussed plan with patient, he/she acknowledged understanding and agreed with plan of care.  Return precautions given for new or worsening symptoms.  Garlon Hatchet, PA-C 11/17/15 1329  Tilden Fossa, MD 11/18/15 [phone]

## 2015-11-17 NOTE — ED Notes (Signed)
Per pt, states flu like symptoms for 3 weeks-cough, fever, and body aches

## 2015-12-08 ENCOUNTER — Emergency Department (HOSPITAL_COMMUNITY)
Admission: EM | Admit: 2015-12-08 | Discharge: 2015-12-08 | Disposition: A | Discharge status: Home / Self Care | Payer: Medicaid Other | Attending: Emergency Medicine | Admitting: Emergency Medicine

## 2015-12-08 ENCOUNTER — Encounter (HOSPITAL_COMMUNITY): Payer: Self-pay | Admitting: *Deleted

## 2015-12-08 ENCOUNTER — Emergency Department (HOSPITAL_COMMUNITY): Payer: Medicaid Other

## 2015-12-08 DIAGNOSIS — Z8659 Personal history of other mental and behavioral disorders: Secondary | ICD-10-CM | POA: Insufficient documentation

## 2015-12-08 DIAGNOSIS — Z794 Long term (current) use of insulin: Secondary | ICD-10-CM | POA: Insufficient documentation

## 2015-12-08 DIAGNOSIS — F17210 Nicotine dependence, cigarettes, uncomplicated: Secondary | ICD-10-CM | POA: Insufficient documentation

## 2015-12-08 DIAGNOSIS — R112 Nausea with vomiting, unspecified: Secondary | ICD-10-CM | POA: Diagnosis not present

## 2015-12-08 DIAGNOSIS — Z880 Allergy status to penicillin: Secondary | ICD-10-CM | POA: Diagnosis not present

## 2015-12-08 DIAGNOSIS — R197 Diarrhea, unspecified: Secondary | ICD-10-CM | POA: Diagnosis not present

## 2015-12-08 DIAGNOSIS — J45909 Unspecified asthma, uncomplicated: Secondary | ICD-10-CM | POA: Diagnosis not present

## 2015-12-08 DIAGNOSIS — Z79899 Other long term (current) drug therapy: Secondary | ICD-10-CM | POA: Insufficient documentation

## 2015-12-08 DIAGNOSIS — E1065 Type 1 diabetes mellitus with hyperglycemia: Secondary | ICD-10-CM | POA: Diagnosis not present

## 2015-12-08 DIAGNOSIS — R739 Hyperglycemia, unspecified: Secondary | ICD-10-CM

## 2015-12-08 LAB — URINE MICROSCOPIC-ADD ON
Bacteria, UA: NONE SEEN
RBC / HPF: NONE SEEN RBC/hpf (ref 0–5)
WBC, UA: NONE SEEN WBC/hpf (ref 0–5)

## 2015-12-08 LAB — COMPREHENSIVE METABOLIC PANEL
ALBUMIN: 4.7 g/dL (ref 3.5–5.0)
ALT: 15 U/L (ref 14–54)
AST: 21 U/L (ref 15–41)
Alkaline Phosphatase: 89 U/L (ref 38–126)
Anion gap: 14 (ref 5–15)
BUN: 11 mg/dL (ref 6–20)
CHLORIDE: 102 mmol/L (ref 101–111)
CO2: 20 mmol/L — AB (ref 22–32)
CREATININE: 0.72 mg/dL (ref 0.44–1.00)
Calcium: 9.3 mg/dL (ref 8.9–10.3)
GFR calc non Af Amer: >60 mL/min (ref >60)
GLUCOSE: 400 mg/dL — AB (ref 65–99)
Potassium: 3.6 mmol/L (ref 3.5–5.1)
SODIUM: 136 mmol/L (ref 135–145)
Total Bilirubin: 0.5 mg/dL (ref 0.3–1.2)
Total Protein: 8.3 g/dL — ABNORMAL HIGH (ref 6.5–8.1)

## 2015-12-08 LAB — BLOOD GAS, VENOUS
ACID-BASE DEFICIT: 3.5 mmol/L — AB (ref 0.0–2.0)
BICARBONATE: 20.7 meq/L (ref 20.0–24.0)
O2 SAT: 89.1 %
PO2 VEN: 57.5 mmHg — AB (ref 31.0–45.0)
Patient temperature: 98.6
TCO2: 19.1 mmol/L (ref 0–100)
pCO2, Ven: 36.4 mmHg — ABNORMAL LOW (ref 45.0–50.0)
pH, Ven: 7.373 — ABNORMAL HIGH (ref 7.250–7.300)

## 2015-12-08 LAB — URINALYSIS, ROUTINE W REFLEX MICROSCOPIC
BILIRUBIN URINE: NEGATIVE
Glucose, UA: >1000 mg/dL — AB
Hgb urine dipstick: NEGATIVE
Ketones, ur: NEGATIVE mg/dL
LEUKOCYTES UA: NEGATIVE
NITRITE: NEGATIVE
PH: 5.5 (ref 5.0–8.0)
Protein, ur: NEGATIVE mg/dL
SPECIFIC GRAVITY, URINE: 1.009 (ref 1.005–1.030)

## 2015-12-08 LAB — BASIC METABOLIC PANEL
Anion gap: 8 (ref 5–15)
BUN: 8 mg/dL (ref 6–20)
CO2: 24 mmol/L (ref 22–32)
Calcium: 8.7 mg/dL — ABNORMAL LOW (ref 8.9–10.3)
Chloride: 107 mmol/L (ref 101–111)
Creatinine, Ser: 0.5 mg/dL (ref 0.44–1.00)
GFR calc Af Amer: >60 mL/min (ref >60)
GFR calc non Af Amer: >60 mL/min (ref >60)
Glucose, Bld: 86 mg/dL (ref 65–99)
Potassium: 3.5 mmol/L (ref 3.5–5.1)
Sodium: 139 mmol/L (ref 135–145)

## 2015-12-08 LAB — CBC
HEMATOCRIT: 40.4 % (ref 36.0–46.0)
HEMOGLOBIN: 13.2 g/dL (ref 12.0–15.0)
MCH: 25.6 pg — ABNORMAL LOW (ref 26.0–34.0)
MCHC: 32.7 g/dL (ref 30.0–36.0)
MCV: 78.3 fL (ref 78.0–100.0)
Platelets: 287 10*3/uL (ref 150–400)
RBC: 5.16 MIL/uL — AB (ref 3.87–5.11)
RDW: 13.4 % (ref 11.5–15.5)
WBC: 10.7 10*3/uL — AB (ref 4.0–10.5)

## 2015-12-08 LAB — CBG MONITORING, ED
GLUCOSE-CAPILLARY: 385 mg/dL — AB (ref 65–99)
GLUCOSE-CAPILLARY: 89 mg/dL (ref 65–99)

## 2015-12-08 LAB — LIPASE, BLOOD: Lipase: 37 U/L (ref 11–51)

## 2015-12-08 MED ORDER — INSULIN GLARGINE 100 UNIT/ML ~~LOC~~ SOLN: 15.0000 [IU] | Freq: Two times a day (BID) | SUBCUTANEOUS | Status: DC

## 2015-12-08 MED ORDER — ONDANSETRON 4 MG PO TBDP
4.0000 mg | ORAL_TABLET | Freq: Once | ORAL | Status: AC | PRN
  Administered 2015-12-08: 4 mg via ORAL
  Filled 2015-12-08: qty 1

## 2015-12-08 MED ORDER — SODIUM CHLORIDE 0.9 % IV BOLUS (SEPSIS)
1000.0000 mL | Freq: Once | INTRAVENOUS | Status: AC
  Administered 2015-12-08: 1000 mL via INTRAVENOUS

## 2015-12-08 MED ORDER — INSULIN ASPART 100 UNIT/ML ~~LOC~~ SOLN
5.0000 [IU] | Freq: Once | SUBCUTANEOUS | Status: AC
  Administered 2015-12-08: 5 [IU] via INTRAVENOUS
  Filled 2015-12-08: qty 1

## 2015-12-08 MED ORDER — INSULIN LISPRO 100 UNIT/ML ~~LOC~~ SOLN: SUBCUTANEOUS | Status: AC

## 2015-12-08 MED ORDER — GLUCOSE BLOOD VI STRP: ORAL_STRIP | Status: AC

## 2015-12-08 NOTE — ED Provider Notes (Signed)
CSN: [id number]     Arrival date & time 12/08/15  1329 History   First MD Initiated Contact with Patient 12/08/15 1433     Chief Complaint  Patient presents with  . Hyperglycemia  . Emesis     (Consider location/radiation/quality/duration/timing/severity/associated sxs/prior Treatment) Patient is a 25 y.o. female presenting with hyperglycemia and vomiting.  Hyperglycemia Blood sugar level PTA:  Not registering with home glucose monitor Severity:  Severe Onset quality:  Sudden Duration:  5 hours Timing:  Constant Progression:  Unchanged Diabetes status:  Controlled with insulin Current diabetic therapy:  Humalog sliding scale, base 5U BLD and Lantus 15U BID Time since last antidiabetic medication:  3 hours Context: recent illness   Context: not change in medication, not new diabetes diagnosis, not noncompliance and not recent change in diet   Relieved by:  Nothing Ineffective treatments:  Insulin Associated symptoms: dehydration, increased thirst, nausea, polyuria and vomiting   Associated symptoms: no abdominal pain, no chest pain, no dysuria, no fever, no increased appetite and no shortness of breath   Risk factors: hx of DKA   Emesis Number of daily episodes:  5-10 Quality:  Stomach contents Associated symptoms: diarrhea   Associated symptoms: no abdominal pain    Molly Benson is a 25 y.o. female with PMH significant for DMT1, asthma, anxiety, PTSD who presents with hyperglycemia.  Patient reports she checked her blood sugar at 9 AM this morning, and it would not register.  She then took 12U insulin.  She rechecked it around 10-11 AM and it was still not registering.  Around that time she began experiencing NBNB emesis and feeling dehydrated.  She then took 5U insulin around 12 PM and does not report improvement.  Associated symptoms include cough, polyuria, loss of appetite, and diarrhea.  Denies fever, chills, abdominal pain, CP, SOB, or urinary symptoms.  She has not had any  changes to her DM regimen or any new medications.  She takes Humalog on a sliding scale with a base of 5U at B,L,D and Lantus 15U BID.  She denies missed doses.  She does report that she has been under a lot of stress recently.  She states she was seen a couple of weeks ago for cough, but states that her cough has not gotten any better.  No hemoptysis, unilateral leg swelling, hx of DVT/PE, recent trauma/surgery, or exogenous estrogen.  Past Medical History  Diagnosis Date  . Diabetes mellitus (HCC)   . Asthma   . Anxiety   . PTSD (post-traumatic stress disorder)    Past Surgical History  Procedure Laterality Date  . Cesarean section      x 2.  . Appendectomy    . Dilation and curettage of uterus     Family History  Problem Relation Age of Onset  . Hypertension Mother   . GER disease Father   . Asthma Brother    Social History  Substance Use Topics  . Smoking status: Current Every Day Smoker -- 0.15 packs/day for 8 years    Types: Cigarettes  . Smokeless tobacco: Never Used  . Alcohol Use: Yes     Comment: occasionally   OB History    No data available     Review of Systems  Constitutional: Negative for fever.  Respiratory: Negative for shortness of breath.   Cardiovascular: Negative for chest pain.  Gastrointestinal: Positive for nausea, vomiting and diarrhea. Negative for abdominal pain.  Endocrine: Positive for polydipsia and polyuria. Negative for polyphagia.  Genitourinary:  Negative for dysuria, urgency and hematuria.  All other systems reviewed and are negative.     Allergies  Amoxicillin; Cleocin; Sulfa antibiotics; Vancomycin; and Zithromax  Home Medications   Prior to Admission medications   Medication Sig Start Date End Date Taking? Authorizing Provider  acetaminophen (TYLENOL) 500 MG tablet Take 1,000 mg by mouth every 6 (six) hours as needed for moderate pain or headache.   Yes Historical Provider, MD  bismuth subsalicylate (PEPTO BISMOL) 262 MG/15ML  suspension Take 30 mLs by mouth every 6 (six) hours as needed for indigestion.   Yes Historical Provider, MD  Caffeine-Magnesium Salicylate (DIUREX PO) Take 1 capsule by mouth daily as needed (for retaining water).   Yes Historical Provider, MD  cetirizine (ZYRTEC ALLERGY) 10 MG tablet Take 1 tablet (10 mg total) by mouth daily. Patient taking differently: Take 10 mg by mouth daily as needed for allergies.  11/17/15  Yes Garlon HatchetLisa M Sanders, PA-C  diphenhydrAMINE (BENADRYL) 25 MG tablet Take 25 mg by mouth every 6 (six) hours as needed for itching, allergies or sleep.   Yes Historical Provider, MD  ferrous sulfate 325 (65 FE) MG tablet Take 325 mg by mouth daily as needed (menstrual bleeding).    Yes Historical Provider, MD  ibuprofen (ADVIL,MOTRIN) 200 MG tablet Take 400 mg by mouth every 6 (six) hours as needed for headache or moderate pain.   Yes Historical Provider, MD  insulin glargine (LANTUS) 100 UNIT/ML injection Inject 15 Units into the skin 2 (two) times daily.   Yes Historical Provider, MD  insulin lispro (HUMALOG) 100 UNIT/ML injection Inject 5-12 Units into the skin 3 (three) times daily before meals. Sliding scale= base of 5 units and increase by 1 unit for every 50. (150-200=6 units and 200-250=7 units)   Yes Historical Provider, MD  Multiple Vitamin (MULTIVITAMIN WITH MINERALS) TABS tablet Take 1 tablet by mouth daily.   Yes Historical Provider, MD  Multiple Vitamins-Minerals (HAIR SKIN AND NAILS FORMULA) TABS Take 1 tablet by mouth daily.   Yes Historical Provider, MD  ondansetron (ZOFRAN ODT) 4 MG disintegrating tablet Take 1 tablet (4 mg total) by mouth every 8 (eight) hours as needed for nausea. 02/22/15  Yes Jennifer Piepenbrink, PA-C  pyridOXINE (B-6) 50 MG tablet Take 50 mg by mouth daily.   Yes Historical Provider, MD  vitamin C (ASCORBIC ACID) 500 MG tablet Take 500 mg by mouth daily.   Yes Historical Provider, MD  benzonatate (TESSALON) 100 MG capsule Take 1 capsule (100 mg total) by  mouth every 8 (eight) hours. Patient not taking: Reported on 12/08/2015 11/17/15   Garlon HatchetLisa M Sanders, PA-C   BP 95/70 mmHg  Pulse 79  Temp(Src) 98.1 F (36.7 C) (Oral)  Resp 16  Ht 5\' 1"  (1.549 m)  Wt 58.968 kg  BMI 24.58 kg/m2  SpO2 100%  LMP 12/08/2015 Physical Exam  Constitutional: She is oriented to person, place, and time. She appears well-developed and well-nourished.  Non-toxic appearance. She does not have a sickly appearance. She does not appear ill.  HENT:  Head: Normocephalic and atraumatic.  Mouth/Throat: Oropharynx is clear and moist and mucous membranes are normal.  Eyes: Conjunctivae are normal. Pupils are equal, round, and reactive to light.  Neck: Normal range of motion. Neck supple.  Cardiovascular: Normal rate, regular rhythm and normal heart sounds.   No murmur heard. Pulmonary/Chest: Effort normal and breath sounds normal. No accessory muscle usage or stridor. No respiratory distress. She has no wheezes. She has no rhonchi.  She has no rales.  No Kussmaul respirations.   Abdominal: Soft. Bowel sounds are normal. She exhibits no distension. There is no tenderness. There is no rebound and no guarding.  Musculoskeletal: Normal range of motion.  Lymphadenopathy:    She has no cervical adenopathy.  Neurological: She is alert and oriented to person, place, and time.  Speech clear without dysarthria.  Skin: Skin is warm and dry.  Psychiatric: She has a normal mood and affect. Her behavior is normal.    ED Course  Procedures (including critical care time) Labs Review Labs Reviewed  CBC - Abnormal; Notable for the following:    WBC 10.7 (*)    RBC 5.16 (*)    MCH 25.6 (*)    All other components within normal limits  URINALYSIS, ROUTINE W REFLEX MICROSCOPIC (NOT AT [facility]) - Abnormal; Notable for the following:    Glucose, UA >1000 (*)    All other components within normal limits  COMPREHENSIVE METABOLIC PANEL - Abnormal; Notable for the following:    CO2 20 (*)     Glucose, Bld 400 (*)    Total Protein 8.3 (*)    All other components within normal limits  BLOOD GAS, VENOUS - Abnormal; Notable for the following:    pH, Ven 7.373 (*)    pCO2, Ven 36.4 (*)    pO2, Ven 57.5 (*)    Acid-base deficit 3.5 (*)    All other components within normal limits  URINE MICROSCOPIC-ADD ON - Abnormal; Notable for the following:    Squamous Epithelial / LPF 0-5 (*)    All other components within normal limits  BASIC METABOLIC PANEL - Abnormal; Notable for the following:    Calcium 8.7 (*)    All other components within normal limits  CBG MONITORING, ED - Abnormal; Notable for the following:    Glucose-Capillary 385 (*)    All other components within normal limits  LIPASE, BLOOD  CBG MONITORING, ED    Imaging Review Dg Chest 2 View  12/08/2015  CLINICAL DATA:  Chest pain and cough EXAM: CHEST  2 VIEW COMPARISON:  November 17, 2015 FINDINGS: Lungs are clear. Heart size and pulmonary vascularity are normal. No adenopathy. No pneumothorax. No bone lesions. IMPRESSION: No edema or consolidation. Electronically Signed   By: Bretta Bang III M.D.   On: 12/08/2015 15:31   I have personally reviewed and evaluated these images and lab results as part of my medical decision-making.   EKG Interpretation None      MDM  Patient with hx of DMT1 presents with N/V and hyperglycemia since 9 AM this morning.  Hx of DKA and admission in the past.  On exam, mucous membranes appear dry.  No Kussmaul respirations.  Heart sounds with mild tachycardia (HR 103), lungs CTAB, abdomen soft and benign.  Low risk using Wells criteria for PE, PERC negative, low suspicion for PE.  Will obtain labs, CXR to evaluate for PNA, and EKG.  IVF ordered.  Concern for DKA.  CBG 385.  CXR negative.  CMP shows glucose 400, CO2 20, AG 14.  UA >1000 glucose, no ketones, no evidence of infection.  Labs indicate mild acidosis.  Patient receiving fluids and will give 5U insulin.  2nd 1L NS ordered.  VBG pH  7.373, bicarb 20.7, CO2 36.4.  No evidence of severe metabolic acidosis.   Repeat CBG 89.  Patient tolerate solid ([place] sandwich) and fluid PO intake without difficulty.  Repeat BMP shows improvement of CO2  24, glucose 86, and AG 8.  Resolution of mild acidosis with insulin and fluids.  No indication for continued emergent intervention or admission at this time.  Patient requesting refills for Lantus and Humalog as well as glucometer test strips.  Refill prescribed.  Patient has new PCP appointment scheduled 01/07/16.  Discussed return precautions.  Patient agrees and acknowledges the above plan for discharge.   Final diagnoses:  Hyperglycemia  Non-intractable vomiting with nausea, vomiting of unspecified type   Case has been discussed with Dr. Particia Nearing who agrees with the above plan for discharge.      Cheri Fowler, PA-C 12/08/15 2009

## 2015-12-08 NOTE — ED Notes (Addendum)
Pt with hx of type 1 diabetes complains hyperglycemia and vomiting since 9PM. Pt states her meter would not read the blood sugar. Pt denies abdominal pain. Pt states she took 17 units of regular insulin at 12PM after checking her sugar. CBG now 385 in triage

## 2015-12-08 NOTE — ED Notes (Signed)
Patient given turkey sandwich and coffee 

## 2015-12-08 NOTE — Discharge Instructions (Signed)
Hyperglycemia °Hyperglycemia occurs when the glucose (sugar) in your blood is too high. Hyperglycemia can happen for many reasons, but it most often happens to people who do not know they have diabetes or are not managing their diabetes properly.  °CAUSES  °Whether you have diabetes or not, there are other causes of hyperglycemia. Hyperglycemia can occur when you have diabetes, but it can also occur in other situations that you might not be as aware of, such as: °Diabetes °· If you have diabetes and are having problems controlling your blood glucose, hyperglycemia could occur because of some of the following reasons: °¨ Not following your meal plan. °¨ Not taking your diabetes medications or not taking it properly. °¨ Exercising less or doing less activity than you normally do. °¨ Being sick. °Pre-diabetes °· This cannot be ignored. Before people develop Type 2 diabetes, they almost always have "pre-diabetes." This is when your blood glucose levels are higher than normal, but not yet high enough to be diagnosed as diabetes. Research has shown that some long-term damage to the body, especially the heart and circulatory system, may already be occurring during pre-diabetes. If you take action to manage your blood glucose when you have pre-diabetes, you may delay or prevent Type 2 diabetes from developing. °Stress °· If you have diabetes, you may be "diet" controlled or on oral medications or insulin to control your diabetes. However, you may find that your blood glucose is higher than usual in the hospital whether you have diabetes or not. This is often referred to as "stress hyperglycemia." Stress can elevate your blood glucose. This happens because of hormones put out by the body during times of stress. If stress has been the cause of your high blood glucose, it can be followed regularly by your caregiver. That way he/she can make sure your hyperglycemia does not continue to get worse or progress to  diabetes. °Steroids °· Steroids are medications that act on the infection fighting system (immune system) to block inflammation or infection. One side effect can be a rise in blood glucose. Most people can produce enough extra insulin to allow for this rise, but for those who cannot, steroids make blood glucose levels go even higher. It is not unusual for steroid treatments to "uncover" diabetes that is developing. It is not always possible to determine if the hyperglycemia will go away after the steroids are stopped. A special blood test called an A1c is sometimes done to determine if your blood glucose was elevated before the steroids were started. °SYMPTOMS °· Thirsty. °· Frequent urination. °· Dry mouth. °· Blurred vision. °· Tired or fatigue. °· Weakness. °· Sleepy. °· Tingling in feet or leg. °DIAGNOSIS  °Diagnosis is made by monitoring blood glucose in one or all of the following ways: °· A1c test. This is a chemical found in your blood. °· Fingerstick blood glucose monitoring. °· Laboratory results. °TREATMENT  °First, knowing the cause of the hyperglycemia is important before the hyperglycemia can be treated. Treatment may include, but is not be limited to: °· Education. °· Change or adjustment in medications. °· Change or adjustment in meal plan. °· Treatment for an illness, infection, etc. °· More frequent blood glucose monitoring. °· Change in exercise plan. °· Decreasing or stopping steroids. °· Lifestyle changes. °HOME CARE INSTRUCTIONS  °· Test your blood glucose as directed. °· Exercise regularly. Your caregiver will give you instructions about exercise. Pre-diabetes or diabetes which comes on with stress is helped by exercising. °· Eat wholesome,   balanced meals. Eat often and at regular, fixed times. Your caregiver or nutritionist will give you a meal plan to guide your sugar intake. °· Being at an ideal weight is important. If needed, losing as little as 10 to 15 pounds may help improve blood  glucose levels. °SEEK MEDICAL CARE IF:  °· You have questions about medicine, activity, or diet. °· You continue to have symptoms (problems such as increased thirst, urination, or weight gain). °SEEK IMMEDIATE MEDICAL CARE IF:  °· You are vomiting or have diarrhea. °· Your breath smells fruity. °· You are breathing faster or slower. °· You are very sleepy or incoherent. °· You have numbness, tingling, or pain in your feet or hands. °· You have chest pain. °· Your symptoms get worse even though you have been following your caregiver's orders. °· If you have any other questions or concerns. °  °This information is not intended to replace advice given to you by your health care provider. Make sure you discuss any questions you have with your health care provider. °  °Document Released: 01/17/2001 Document Revised: 10/16/2011 Document Reviewed: 03/30/2015 °Elsevier Interactive Patient Education ©2016 [employer] ° °

## 2015-12-08 NOTE — ED Notes (Signed)
Patient was alert, oriented and stable upon discharge. RN went over AVS and patient had no further questions.  

## 2015-12-19 ENCOUNTER — Encounter (HOSPITAL_COMMUNITY): Payer: Self-pay

## 2015-12-19 ENCOUNTER — Emergency Department (HOSPITAL_COMMUNITY): Payer: Medicaid Other

## 2015-12-19 ENCOUNTER — Emergency Department (HOSPITAL_COMMUNITY)
Admission: EM | Admit: 2015-12-19 | Discharge: 2015-12-20 | Disposition: A | Discharge status: Home / Self Care | Payer: Medicaid Other | Source: Home / Self Care | Attending: Emergency Medicine | Admitting: Emergency Medicine

## 2015-12-19 DIAGNOSIS — Z880 Allergy status to penicillin: Secondary | ICD-10-CM

## 2015-12-19 DIAGNOSIS — R05 Cough: Secondary | ICD-10-CM

## 2015-12-19 DIAGNOSIS — R634 Abnormal weight loss: Secondary | ICD-10-CM | POA: Insufficient documentation

## 2015-12-19 DIAGNOSIS — R61 Generalized hyperhidrosis: Secondary | ICD-10-CM

## 2015-12-19 DIAGNOSIS — R590 Localized enlarged lymph nodes: Secondary | ICD-10-CM

## 2015-12-19 DIAGNOSIS — F17210 Nicotine dependence, cigarettes, uncomplicated: Secondary | ICD-10-CM

## 2015-12-19 DIAGNOSIS — R1084 Generalized abdominal pain: Secondary | ICD-10-CM | POA: Insufficient documentation

## 2015-12-19 DIAGNOSIS — L02811 Cutaneous abscess of head [any part, except face]: Secondary | ICD-10-CM | POA: Insufficient documentation

## 2015-12-19 DIAGNOSIS — E119 Type 2 diabetes mellitus without complications: Secondary | ICD-10-CM | POA: Insufficient documentation

## 2015-12-19 DIAGNOSIS — Z3202 Encounter for pregnancy test, result negative: Secondary | ICD-10-CM

## 2015-12-19 DIAGNOSIS — L0291 Cutaneous abscess, unspecified: Secondary | ICD-10-CM

## 2015-12-19 DIAGNOSIS — R112 Nausea with vomiting, unspecified: Secondary | ICD-10-CM

## 2015-12-19 DIAGNOSIS — J45909 Unspecified asthma, uncomplicated: Secondary | ICD-10-CM | POA: Insufficient documentation

## 2015-12-19 DIAGNOSIS — Z8659 Personal history of other mental and behavioral disorders: Secondary | ICD-10-CM | POA: Insufficient documentation

## 2015-12-19 DIAGNOSIS — Z794 Long term (current) use of insulin: Secondary | ICD-10-CM | POA: Insufficient documentation

## 2015-12-19 DIAGNOSIS — L03211 Cellulitis of face: Secondary | ICD-10-CM | POA: Insufficient documentation

## 2015-12-19 DIAGNOSIS — Z79899 Other long term (current) drug therapy: Secondary | ICD-10-CM | POA: Insufficient documentation

## 2015-12-19 DIAGNOSIS — R197 Diarrhea, unspecified: Secondary | ICD-10-CM | POA: Insufficient documentation

## 2015-12-19 DIAGNOSIS — M542 Cervicalgia: Secondary | ICD-10-CM

## 2015-12-19 DIAGNOSIS — R0789 Other chest pain: Secondary | ICD-10-CM | POA: Insufficient documentation

## 2015-12-19 DIAGNOSIS — B999 Unspecified infectious disease: Secondary | ICD-10-CM

## 2015-12-19 DIAGNOSIS — F1110 Opioid abuse, uncomplicated: Secondary | ICD-10-CM | POA: Insufficient documentation

## 2015-12-19 LAB — URINALYSIS, ROUTINE W REFLEX MICROSCOPIC
Bilirubin Urine: NEGATIVE
Glucose, UA: NEGATIVE mg/dL
Hgb urine dipstick: NEGATIVE
Ketones, ur: NEGATIVE mg/dL
Leukocytes, UA: NEGATIVE
Nitrite: NEGATIVE
Protein, ur: NEGATIVE mg/dL
Specific Gravity, Urine: 1.01 (ref 1.005–1.030)
pH: 6 (ref 5.0–8.0)

## 2015-12-19 LAB — RAPID URINE DRUG SCREEN, HOSP PERFORMED
Amphetamines: NOT DETECTED
Barbiturates: NOT DETECTED
Benzodiazepines: NOT DETECTED
Cocaine: NOT DETECTED
Opiates: POSITIVE — AB
Tetrahydrocannabinol: NOT DETECTED

## 2015-12-19 LAB — CBC WITH DIFFERENTIAL/PLATELET
Basophils Absolute: 0 10*3/uL (ref 0.0–0.1)
Basophils Relative: 0 %
Eosinophils Absolute: 0 10*3/uL (ref 0.0–0.7)
Eosinophils Relative: 0 %
HCT: 41.3 % (ref 36.0–46.0)
Hemoglobin: 13.4 g/dL (ref 12.0–15.0)
Lymphocytes Relative: 22 %
Lymphs Abs: 2.2 10*3/uL (ref 0.7–4.0)
MCH: 25.3 pg — ABNORMAL LOW (ref 26.0–34.0)
MCHC: 32.4 g/dL (ref 30.0–36.0)
MCV: 77.9 fL — ABNORMAL LOW (ref 78.0–100.0)
Monocytes Absolute: 1 10*3/uL (ref 0.1–1.0)
Monocytes Relative: 10 %
Neutro Abs: 6.8 10*3/uL (ref 1.7–7.7)
Neutrophils Relative %: 68 %
Platelets: 284 10*3/uL (ref 150–400)
RBC: 5.3 MIL/uL — ABNORMAL HIGH (ref 3.87–5.11)
RDW: 13.3 % (ref 11.5–15.5)
WBC: 10.1 10*3/uL (ref 4.0–10.5)

## 2015-12-19 LAB — COMPREHENSIVE METABOLIC PANEL
ALT: 17 U/L (ref 14–54)
AST: 14 U/L — ABNORMAL LOW (ref 15–41)
Albumin: 4.4 g/dL (ref 3.5–5.0)
Alkaline Phosphatase: 98 U/L (ref 38–126)
Anion gap: 7 (ref 5–15)
BUN: 11 mg/dL (ref 6–20)
CO2: 26 mmol/L (ref 22–32)
Calcium: 9.4 mg/dL (ref 8.9–10.3)
Chloride: 103 mmol/L (ref 101–111)
Creatinine, Ser: 0.59 mg/dL (ref 0.44–1.00)
GFR calc Af Amer: >60 mL/min (ref >60)
GFR calc non Af Amer: >60 mL/min (ref >60)
Glucose, Bld: 101 mg/dL — ABNORMAL HIGH (ref 65–99)
Potassium: 3.7 mmol/L (ref 3.5–5.1)
Sodium: 136 mmol/L (ref 135–145)
Total Bilirubin: 0.3 mg/dL (ref 0.3–1.2)
Total Protein: 7.8 g/dL (ref 6.5–8.1)

## 2015-12-19 LAB — BLOOD GAS, VENOUS
Acid-Base Excess: 2.6 mmol/L — ABNORMAL HIGH (ref 0.0–2.0)
Bicarbonate: 26.9 mEq/L — ABNORMAL HIGH (ref 20.0–24.0)
O2 Saturation: 41 %
Patient temperature: 98.6
TCO2: 24.2 mmol/L (ref 0–100)
pCO2, Ven: 42.7 mmHg — ABNORMAL LOW (ref 45.0–50.0)
pH, Ven: 7.416 — ABNORMAL HIGH (ref 7.250–7.300)

## 2015-12-19 LAB — POC URINE PREG, ED: Preg Test, Ur: NEGATIVE

## 2015-12-19 LAB — LIPASE, BLOOD: Lipase: 17 U/L (ref 11–51)

## 2015-12-19 LAB — CBG MONITORING, ED
GLUCOSE-CAPILLARY: 73 mg/dL (ref 65–99)
Glucose-Capillary: 107 mg/dL — ABNORMAL HIGH (ref 65–99)

## 2015-12-19 MED ORDER — LIDOCAINE HCL (PF) 1 % IJ SOLN: 5.0000 mL | Freq: Once | INTRAMUSCULAR | Status: DC

## 2015-12-19 MED ORDER — MORPHINE SULFATE (PF) 4 MG/ML IV SOLN
4.0000 mg | Freq: Once | INTRAVENOUS | Status: AC
  Administered 2015-12-19: 4 mg via INTRAVENOUS
  Filled 2015-12-19: qty 1

## 2015-12-19 MED ORDER — IOPAMIDOL (ISOVUE-300) INJECTION 61%
80.0000 mL | Freq: Once | INTRAVENOUS | Status: AC | PRN
  Administered 2015-12-19: 80 mL via INTRAVENOUS

## 2015-12-19 MED ORDER — ONDANSETRON HCL 4 MG/2ML IJ SOLN
4.0000 mg | Freq: Once | INTRAMUSCULAR | Status: AC
  Administered 2015-12-19: 4 mg via INTRAVENOUS
  Filled 2015-12-19: qty 2

## 2015-12-19 MED ORDER — SODIUM CHLORIDE 0.9 % IV BOLUS (SEPSIS)
1000.0000 mL | Freq: Once | INTRAVENOUS | Status: AC
  Administered 2015-12-19: 1000 mL via INTRAVENOUS

## 2015-12-19 MED ORDER — LIDOCAINE HCL 1 % IJ SOLN
5.0000 mL | Freq: Once | INTRAMUSCULAR | Status: AC
  Administered 2015-12-19: 5 mL
  Filled 2015-12-19: qty 20

## 2015-12-19 NOTE — ED Notes (Signed)
Pt cannot use restroom at this time, aware urine specimen is needed.  

## 2015-12-19 NOTE — ED Provider Notes (Signed)
CSN: [id number]     Arrival date & time 12/19/15  1615 History  By signing my name below, I, Linna Darner, attest that this documentation has been prepared under the direction and in the presence of non-physician practitioner, Buel Ream, PA-C. Electronically Signed: Linna Darner, Scribe. 12/19/2015. 5:29 PM.   Chief Complaint  Patient presents with  . Abscess     The history is provided by the patient. No language interpreter was used.     HPI Comments: Molly Benson is a 25 y.o. female with h/o DM, asthma, anxiety, and PTSD who presents to the Emergency Department complaining of sudden onset, constant, worsening, stabbing, facial swelling and pain due to left scalp abscess for the last two weeks. Pt notes that she has also been experiencing intermittent fever, chills, and night sweats for the last two weeks. She endorses a generalized, throbbing, sharp, waxing and waning, 8/10, headache for the last two weeks; she states that she experienced headaches frequently many years ago. She notes left-sided neck pain as well. Pt states that she has been experiencing cold symptoms for two months intermittently. She endorses cough, mild chest tightness, and throat irritation. She also endorses 2 months of vomiting, diarrhea, nausea, weakness. She states that she is nauseous currently. She reports that she has lost 3-5 pounds over the last few weeks; she has been eating normally. Pt has applied Mupirocin ointment to the swollen areas on her face with no relief; she has not taken any other medications for her symptoms. Pt states that her blood sugar has been very high and not registering on her home glucometer for the last few weeks. She states that she was in DKA a couple of weeks ago; she was seen here and given insulin but was not admitted. She has not seen her PCP for her symptoms.She is scheduled to see a new PCP at the end of the month as she just moved here. She notes no alleviating or exacerbating  factors. She denies abscess drainage, increased urinary frequency, dysuria, eye pain, SOB, chest pain, neck stiffness, or any other associated symptoms. Patient reports possible, vague mild bilateral blurry vision.  Past Medical History  Diagnosis Date  . Diabetes mellitus (HCC)   . Asthma   . Anxiety   . PTSD (post-traumatic stress disorder)    Past Surgical History  Procedure Laterality Date  . Cesarean section      x 2.  . Appendectomy    . Dilation and curettage of uterus    . Eyelid  surgery     Family History  Problem Relation Age of Onset  . Hypertension Mother   . GER disease Father   . Asthma Brother    Social History  Substance Use Topics  . Smoking status: Current Every Day Smoker -- 0.15 packs/day for 8 years    Types: Cigarettes  . Smokeless tobacco: Never Used  . Alcohol Use: Yes     Comment: occasionally   OB History    No data available     Review of Systems  Constitutional: Positive for fever, chills, diaphoresis and unexpected weight change (decreased). Negative for appetite change.  HENT: Positive for facial swelling. Negative for sore throat.   Eyes: Negative for pain.  Respiratory: Positive for cough and chest tightness. Negative for shortness of breath.   Cardiovascular: Negative for chest pain.  Gastrointestinal: Positive for nausea, vomiting and diarrhea. Negative for abdominal pain.  Genitourinary: Negative for dysuria and frequency.  Musculoskeletal: Positive for neck pain (  left). Negative for back pain and neck stiffness.  Skin: Positive for wound (abscess). Negative for rash.  Neurological: Positive for weakness and headaches.  Psychiatric/Behavioral: The patient is not nervous/anxious.     Allergies  Amoxicillin; Cleocin; Sulfa antibiotics; Vancomycin; and Zithromax  Home Medications   Prior to Admission medications   Medication Sig Start Date End Date Taking? Authorizing Provider  acetaminophen (TYLENOL) 500 MG tablet Take 1,000 mg  by mouth every 6 (six) hours as needed for moderate pain or headache.   Yes Historical Provider, MD  bismuth subsalicylate (PEPTO BISMOL) 262 MG/15ML suspension Take 30 mLs by mouth every 6 (six) hours as needed for indigestion.   Yes Historical Provider, MD  Caffeine-Magnesium Salicylate (DIUREX PO) Take 1 capsule by mouth daily as needed (for retaining water).   Yes Historical Provider, MD  ibuprofen (ADVIL,MOTRIN) 200 MG tablet Take 600 mg by mouth every 6 (six) hours as needed for headache or moderate pain.    Yes Historical Provider, MD  insulin glargine (LANTUS) 100 UNIT/ML injection Inject 0.15 mLs (15 Units total) into the skin 2 (two) times daily. 12/08/15  Yes Kayla Rose, PA-C  insulin lispro (HUMALOG) 100 UNIT/ML injection Inject 5-12 units into skin 3 times daily before meals.  Sliding scale = base of 5 units and increase by 1 unit for every 50. (150-200=6 units 200-250= 7 units 12/08/15  Yes Cheri Fowler, PA-C  Multiple Vitamin (MULTIVITAMIN WITH MINERALS) TABS tablet Take 1 tablet by mouth daily.   Yes Historical Provider, MD  Multiple Vitamins-Minerals (HAIR SKIN AND NAILS FORMULA) TABS Take 1 tablet by mouth daily.   Yes Historical Provider, MD  ondansetron (ZOFRAN ODT) 4 MG disintegrating tablet Take 1 tablet (4 mg total) by mouth every 8 (eight) hours as needed for nausea. 02/22/15  Yes Jennifer Piepenbrink, PA-C  benzonatate (TESSALON) 100 MG capsule Take 1 capsule (100 mg total) by mouth every 8 (eight) hours. Patient not taking: Reported on 12/08/2015 11/17/15   Garlon Hatchet, PA-C  cetirizine (ZYRTEC ALLERGY) 10 MG tablet Take 1 tablet (10 mg total) by mouth daily. Patient not taking: Reported on 12/19/2015 11/17/15   Garlon Hatchet, PA-C  doxycycline (VIBRAMYCIN) 100 MG capsule Take 1 capsule (100 mg total) by mouth 2 (two) times daily. 12/20/15   Emi Holes, PA-C  glucose blood test strip Use as instructed 12/08/15   Cheri Fowler, PA-C  HYDROcodone-acetaminophen (NORCO/VICODIN) 5-325 MG  tablet Take 2 tablets by mouth every 4 (four) hours as needed. 12/20/15   Solomiya Pascale M Reona Zendejas, PA-C   BP 104/69 mmHg  Pulse 93  Temp(Src) 98 F (36.7 C) (Oral)  Resp 18  Ht 5\' 1"  (1.549 m)  Wt 58.968 kg  BMI 24.58 kg/m2  SpO2 99%  LMP 12/08/2015 Physical Exam  Constitutional: She appears well-developed and well-nourished. No distress.  HENT:  Head: Normocephalic and atraumatic.    Mouth/Throat: Oropharynx is clear and moist. No oropharyngeal exudate.  0.5cm scabbed, punctate area to left hairline and scalp with surrounding tenderness as demarcated on image, patient states her pain is worse over this scabbed area; no tenderness elicited on the right side; EOMs not painful, however patient describes a "tight feeling" in her left eye with movement in comparison to the right; 3 cm diameter area of fluid/swelling over left frontal bone  Eyes: Conjunctivae are normal. Pupils are equal, round, and reactive to light. Right eye exhibits no discharge. Left eye exhibits no discharge. No scleral icterus. Right eye exhibits normal extraocular motion.  Left eye exhibits normal extraocular motion.  No erythema noted on face or around eyes  Neck: Normal range of motion. Neck supple. No rigidity. Normal range of motion present. No thyromegaly present.    2cm mobile, tender lymph node to L upper cervical chain, most likely reactive; no meningismus  Cardiovascular: Normal rate, regular rhythm, normal heart sounds and intact distal pulses.  Exam reveals no gallop and no friction rub.   No murmur heard. Pulmonary/Chest: Effort normal and breath sounds normal. No stridor. No respiratory distress. She has no wheezes. She has no rales.  Abdominal: Soft. Bowel sounds are normal. She exhibits no distension. There is tenderness (generalized). There is no rebound and no guarding.  Musculoskeletal: She exhibits no edema.  Lymphadenopathy:    She has cervical adenopathy.  Neurological: She is alert. Coordination  normal.  CN 3-12 intact, normal sensation, 5/5 strength in all 4 extremities, equal bilateral grip strength, no ataxia with finger to nose  Skin: Skin is warm and dry. No rash noted. She is not diaphoretic. No pallor.  Psychiatric: She has a normal mood and affect.  Nursing note and vitals reviewed.   ED Course  .Marland KitchenIncision and Drainage Date/Time: 12/20/2015 12:24 AM Performed by: Emi Holes Authorized by: Emi Holes Consent: Verbal consent obtained. Consent given by: patient Type: abscess Body area: head Location details: scalp Anesthesia: local infiltration Local anesthetic: lidocaine 1% without epinephrine Anesthetic total: 1.5 ml Scalpel size: 11 Needle gauge: 27. Incision type: single straight Incision depth: dermal Complexity: simple Drainage: purulent and  bloody Drainage amount: scant Wound treatment: wound left open Packing material: 1/4 in gauze Patient tolerance: Patient tolerated the procedure well with no immediate complications Comments: Patient felt improved pain, but did begin to feel nauseous again after procedure. Zofran ordered for discharge.   (including critical care time)  DIAGNOSTIC STUDIES: Oxygen Saturation is 100% on RA, normal by my interpretation.    COORDINATION OF CARE: 5:29 PM Discussed treatment plan with pt at bedside and pt agreed to plan.  Labs Review Labs Reviewed  CBC WITH DIFFERENTIAL/PLATELET - Abnormal; Notable for the following:    RBC 5.30 (*)    MCV 77.9 (*)    MCH 25.3 (*)    All other components within normal limits  COMPREHENSIVE METABOLIC PANEL - Abnormal; Notable for the following:    Glucose, Bld 101 (*)    AST 14 (*)    All other components within normal limits  URINE RAPID DRUG SCREEN, HOSP PERFORMED - Abnormal; Notable for the following:    Opiates POSITIVE (*)    All other components within normal limits  BLOOD GAS, VENOUS - Abnormal; Notable for the following:    pH, Ven 7.416 (*)    pCO2, Ven 42.7  (*)    Bicarbonate 26.9 (*)    Acid-Base Excess 2.6 (*)    All other components within normal limits  CBG MONITORING, ED - Abnormal; Notable for the following:    Glucose-Capillary 107 (*)    All other components within normal limits  LIPASE, BLOOD  URINALYSIS, ROUTINE W REFLEX MICROSCOPIC (NOT AT [facility])  POC URINE PREG, ED  CBG MONITORING, ED    Imaging Review Ct Head W Contrast  12/19/2015  CLINICAL DATA:  Initial valuation for acute scalp swelling.  Pain. EXAM: CT HEAD WITH CONTRAST TECHNIQUE: Contiguous axial images were obtained from the base of the skull through the vertex with intravenous contrast. CONTRAST:  80mL ISOVUE-300 IOPAMIDOL (ISOVUE-300) INJECTION 61% COMPARISON:  None.  FINDINGS: There is no acute intracranial hemorrhage or infarct. No mass lesion or midline shift. Gray-white matter differentiation is well maintained. Ventricles are normal in size without evidence of hydrocephalus. CSF containing spaces are within normal limits. No extra-axial fluid collection. The calvarium is intact. Orbital soft tissues are within normal limits. The paranasal sinuses and mastoid air cells are well pneumatized and free of fluid. Soft tissue swelling present at the left frontal scalp. There is a small more focal phlegmonous hypodensity measuring approximately 7 mm just deep to the skin (series 4, image 43). This is suspicious for phlegmon/ early abscess. No frank rim enhancing or drainable fluid collection identified. Scalp soft tissues otherwise unremarkable. IMPRESSION: 1. Soft tissue swelling involving the left frontal scalp, suspicious for cellulitis. A superimposed more focal 7 mm hypodensity within the subcutaneous fat of the left frontal scalp with overlying skin thickening is suspicious for phlegmon/early abscess. No other frank rim enhancing or drainable fluid collection identified. 2. No acute intracranial process identified. Electronically Signed   By: Rise MuBenjamin  McClintock M.D.   On:  12/19/2015 21:39   I have personally reviewed and evaluated these images and lab results as part of my medical decision-making.   EKG Interpretation None      MDM   CBC unchanged from past. CMP shows glucose 101, AST 14. Lipase 17. ABG shows pH 7.416. PCO2 42.7. Bicarbonate 26.9. Acid base excess 2.6. UA unremarkable. Urine pregnancy negative. CT head with contrast shows soft tissue swelling involving the left frontal scalp, suspicious for cellulitis; a superimposed more focal 7 mm hypodensity within the subcutaneous fat of the left frontal scalp with overlying skin thickening suspicious for phlegmon/early abscess; no other frank enhancing or drainable fluid collection identified; no acute intracranial process identified. Patient given fluids in ED. Nausea controlled with Zofran. Pain decreased with morphine. I&D performed of pimple area with minimal railing drainage. Patient's pain improved following procedure. Patient evaluated and discussed at length with Dr. Dalene SeltzerSchlossman who agreed we will treat patient with outpatient doxycycline and follow-up with ENT. Dr. Dalene SeltzerSchlossman discussed CT scan with radiologist for possible early subglial abscess who suggested outpatient antibiotics with close follow-up with ENT. Strict return precautions extensively discussed and emphasized. Patient vitals stable throughout ED course. Patient discharged in satisfactory condition. Patient tolerating food and fluids throughout ED course.  Final diagnoses:  Abscess  Cellulitis of face    I personally performed the services described in this documentation, which was scribed in my presence. The recorded information has been reviewed and is accurate.   [id number]Zeric Baranowski M Jim Lundin, PA-C 12/20/15 [id number]  Alvira MondayErin Schlossman, MD 12/20/15 [phone]

## 2015-12-19 NOTE — ED Notes (Signed)
Patient given sandwich and something to drink.

## 2015-12-19 NOTE — ED Notes (Signed)
Patient states she noticed a pimp at the hairline on the left side and area has gotten increasingly sore and now has slight swelling and pain to mid scalp and down the left side of her face.

## 2015-12-20 ENCOUNTER — Emergency Department (HOSPITAL_COMMUNITY): Payer: Medicaid Other

## 2015-12-20 ENCOUNTER — Inpatient Hospital Stay (HOSPITAL_COMMUNITY)
Admission: EM | Admit: 2015-12-20 | Discharge: 2015-12-22 | DRG: 121 | Disposition: A | Discharge status: Home / Self Care | Payer: Medicaid Other | Attending: Internal Medicine | Admitting: Internal Medicine

## 2015-12-20 ENCOUNTER — Encounter (HOSPITAL_COMMUNITY): Payer: Self-pay | Admitting: Emergency Medicine

## 2015-12-20 DIAGNOSIS — E876 Hypokalemia: Secondary | ICD-10-CM | POA: Diagnosis present

## 2015-12-20 DIAGNOSIS — K219 Gastro-esophageal reflux disease without esophagitis: Secondary | ICD-10-CM | POA: Diagnosis present

## 2015-12-20 DIAGNOSIS — E872 Acidosis: Secondary | ICD-10-CM | POA: Diagnosis present

## 2015-12-20 DIAGNOSIS — L02811 Cutaneous abscess of head [any part, except face]: Secondary | ICD-10-CM | POA: Diagnosis present

## 2015-12-20 DIAGNOSIS — Z881 Allergy status to other antibiotic agents status: Secondary | ICD-10-CM | POA: Diagnosis not present

## 2015-12-20 DIAGNOSIS — H05012 Cellulitis of left orbit: Principal | ICD-10-CM | POA: Diagnosis present

## 2015-12-20 DIAGNOSIS — Z8249 Family history of ischemic heart disease and other diseases of the circulatory system: Secondary | ICD-10-CM

## 2015-12-20 DIAGNOSIS — Z720 Tobacco use: Secondary | ICD-10-CM | POA: Insufficient documentation

## 2015-12-20 DIAGNOSIS — E109 Type 1 diabetes mellitus without complications: Secondary | ICD-10-CM | POA: Diagnosis not present

## 2015-12-20 DIAGNOSIS — Z889 Allergy status to unspecified drugs, medicaments and biological substances status: Secondary | ICD-10-CM | POA: Insufficient documentation

## 2015-12-20 DIAGNOSIS — J4520 Mild intermittent asthma, uncomplicated: Secondary | ICD-10-CM | POA: Diagnosis not present

## 2015-12-20 DIAGNOSIS — Z794 Long term (current) use of insulin: Secondary | ICD-10-CM

## 2015-12-20 DIAGNOSIS — L03211 Cellulitis of face: Secondary | ICD-10-CM | POA: Diagnosis present

## 2015-12-20 DIAGNOSIS — F17210 Nicotine dependence, cigarettes, uncomplicated: Secondary | ICD-10-CM | POA: Diagnosis present

## 2015-12-20 DIAGNOSIS — E1065 Type 1 diabetes mellitus with hyperglycemia: Secondary | ICD-10-CM | POA: Diagnosis present

## 2015-12-20 DIAGNOSIS — J45909 Unspecified asthma, uncomplicated: Secondary | ICD-10-CM | POA: Diagnosis present

## 2015-12-20 DIAGNOSIS — H05019 Cellulitis of unspecified orbit: Secondary | ICD-10-CM | POA: Insufficient documentation

## 2015-12-20 LAB — CBC
HCT: 42.2 % (ref 36.0–46.0)
Hemoglobin: 13.7 g/dL (ref 12.0–15.0)
MCH: 25.7 pg — AB (ref 26.0–34.0)
MCHC: 32.5 g/dL (ref 30.0–36.0)
MCV: 79.2 fL (ref 78.0–100.0)
PLATELETS: 239 10*3/uL (ref 150–400)
RBC: 5.33 MIL/uL — ABNORMAL HIGH (ref 3.87–5.11)
RDW: 13.5 % (ref 11.5–15.5)
WBC: 9.1 10*3/uL (ref 4.0–10.5)

## 2015-12-20 LAB — I-STAT CHEM 8, ED
CALCIUM ION: 1.13 mmol/L (ref 1.12–1.23)
CHLORIDE: 99 mmol/L — AB (ref 101–111)
Creatinine, Ser: 0.5 mg/dL (ref 0.44–1.00)
GLUCOSE: 328 mg/dL — AB (ref 65–99)
HCT: 38 % (ref 36.0–46.0)
Hemoglobin: 12.9 g/dL (ref 12.0–15.0)
Potassium: 3.8 mmol/L (ref 3.5–5.1)
Sodium: 138 mmol/L (ref 135–145)
TCO2: 24 mmol/L (ref 0–100)

## 2015-12-20 LAB — I-STAT CG4 LACTIC ACID, ED: LACTIC ACID, VENOUS: 2.08 mmol/L — AB (ref 0.5–2.0)

## 2015-12-20 LAB — CBG MONITORING, ED: GLUCOSE-CAPILLARY: 229 mg/dL — AB (ref 65–99)

## 2015-12-20 MED ORDER — DOXYCYCLINE HYCLATE 100 MG PO TABS
100.0000 mg | ORAL_TABLET | Freq: Once | ORAL | Status: AC
  Administered 2015-12-20: 100 mg via ORAL
  Filled 2015-12-20: qty 1

## 2015-12-20 MED ORDER — HYDROCODONE-ACETAMINOPHEN 5-325 MG PO TABS: 2.0000 | ORAL_TABLET | ORAL | Status: DC | PRN

## 2015-12-20 MED ORDER — MORPHINE SULFATE (PF) 4 MG/ML IV SOLN
4.0000 mg | Freq: Once | INTRAVENOUS | Status: DC
  Filled 2015-12-20: qty 1

## 2015-12-20 MED ORDER — DOXYCYCLINE HYCLATE 100 MG PO CAPS: 100.0000 mg | ORAL_CAPSULE | Freq: Two times a day (BID) | ORAL | Status: DC

## 2015-12-20 MED ORDER — IOPAMIDOL (ISOVUE-300) INJECTION 61%
75.0000 mL | Freq: Once | INTRAVENOUS | Status: AC | PRN
  Administered 2015-12-20: 75 mL via INTRAVENOUS

## 2015-12-20 MED ORDER — ONDANSETRON HCL 4 MG/2ML IJ SOLN: 4.0000 mg | Freq: Once | INTRAMUSCULAR | Status: DC

## 2015-12-20 MED ORDER — MORPHINE SULFATE (PF) 4 MG/ML IV SOLN
4.0000 mg | Freq: Once | INTRAVENOUS | Status: AC
  Administered 2015-12-20: 4 mg via INTRAVENOUS
  Filled 2015-12-20: qty 1

## 2015-12-20 MED ORDER — LINEZOLID 600 MG/300ML IV SOLN
600.0000 mg | Freq: Two times a day (BID) | INTRAVENOUS | Status: DC
  Administered 2015-12-20 – 2015-12-21 (×2): 600 mg via INTRAVENOUS
  Filled 2015-12-20 (×4): qty 300

## 2015-12-20 NOTE — ED Notes (Signed)
Flushed iv 20 ml

## 2015-12-20 NOTE — ED Notes (Addendum)
Message Request for linezolid made for pharmacy to send

## 2015-12-20 NOTE — ED Notes (Signed)
Pt in CT.

## 2015-12-20 NOTE — ED Notes (Signed)
RN starting IV, drawing labs 

## 2015-12-20 NOTE — ED Notes (Signed)
Pt  Informed provider want NPO until stated

## 2015-12-20 NOTE — ED Notes (Signed)
Pt asked to get in gown/ will return with pain meds and medicine

## 2015-12-20 NOTE — ED Notes (Signed)
Pt reports she was seen here for same last night, but the swelling in her L eye did not start until she woke up this am.  Pt reports it started as a pimple in her forehead and gradually became worse.  Swelling radiating from her forehead to her L eye.  Unable to open her L eye at this time.  Pt reports pain.  Pt reports with her diabetes, she normally has pretty bad pimples.

## 2015-12-20 NOTE — ED Notes (Addendum)
Pt seen yesterday for cellulitis on the left side of her face. Pt reports eye was swollen shut when she woke up this morning. Sent by Dr. Teola BradleyBarr. Denies tongue/throat swelling and SOB. Reports taking antibiotics and tylenol at 1000.

## 2015-12-20 NOTE — ED Provider Notes (Signed)
CSN: [id number]     Arrival date & time 12/20/15  1719 History   First MD Initiated Contact with Patient 12/20/15 1850     Chief Complaint  Patient presents with  . Facial Swelling     (Consider location/radiation/quality/duration/timing/severity/associated sxs/prior Treatment) HPI Complains of left-sided facial pain and swelling onset 2 days ago. She states swelling started out as a "pimple" on my forehead which was lanced in the emergency department. Patient was started on doxycycline and Tylenol No. 3 as outpatient. She was asked to follow-up with ear nose and throat physician today. She went to his office today. She reports that he advised her to come here as he was concerned about worsening infection. This morning she awakened with her left eye swollen shut. Pain is moderate at present. She's also had vomiting today last episode of nearly prior to coming here. Last normal menstrual period 2 weeks ago, no other associated symptoms. Past Medical History  Diagnosis Date  . Diabetes mellitus (HCC)   . Asthma   . Anxiety   . PTSD (post-traumatic stress disorder)    Past Surgical History  Procedure Laterality Date  . Cesarean section      x 2.  . Appendectomy    . Dilation and curettage of uterus    . Eyelid  surgery     Family History  Problem Relation Age of Onset  . Hypertension Mother   . GER disease Father   . Asthma Brother    Social History  Substance Use Topics  . Smoking status: Current Every Day Smoker -- 0.15 packs/day for 8 years    Types: Cigarettes  . Smokeless tobacco: Never Used  . Alcohol Use: Yes     Comment: occasionally   OB History    No data available     Review of Systems  Constitutional: Negative.   HENT: Positive for facial swelling.        Facial pain  Respiratory: Negative.   Cardiovascular: Negative.   Gastrointestinal: Positive for vomiting.       Denies nausea at present  Musculoskeletal: Negative.   Skin: Negative.    Allergic/Immunologic: Positive for immunocompromised state.       Diabetic  Neurological: Negative.   Psychiatric/Behavioral: Negative.   All other systems reviewed and are negative.     Allergies  Amoxicillin; Cleocin; Sulfa antibiotics; Vancomycin; and Zithromax  Home Medications   Prior to Admission medications   Medication Sig Start Date End Date Taking? Authorizing Provider  acetaminophen (TYLENOL) 500 MG tablet Take 1,000 mg by mouth every 6 (six) hours as needed for moderate pain or headache.   Yes Historical Provider, MD  bismuth subsalicylate (PEPTO BISMOL) 262 MG/15ML suspension Take 30 mLs by mouth every 6 (six) hours as needed for indigestion.   Yes Historical Provider, MD  Caffeine-Magnesium Salicylate (DIUREX PO) Take 1 capsule by mouth daily as needed (for retaining water).   Yes Historical Provider, MD  doxycycline (VIBRAMYCIN) 100 MG capsule Take 1 capsule (100 mg total) by mouth 2 (two) times daily. 12/20/15  Yes Waylan Boga Law, PA-C  glucose blood test strip Use as instructed 12/08/15  Yes Cheri Fowler, PA-C  HYDROcodone-acetaminophen (NORCO/VICODIN) 5-325 MG tablet Take 2 tablets by mouth every 4 (four) hours as needed. Patient taking differently: Take 2 tablets by mouth every 4 (four) hours as needed for moderate pain or severe pain.  12/20/15  Yes Alexandra M Law, PA-C  ibuprofen (ADVIL,MOTRIN) 200 MG tablet Take 600 mg by mouth  every 6 (six) hours as needed for headache or moderate pain.    Yes Historical Provider, MD  insulin glargine (LANTUS) 100 UNIT/ML injection Inject 0.15 mLs (15 Units total) into the skin 2 (two) times daily. 12/08/15  Yes Kayla Rose, PA-C  insulin lispro (HUMALOG) 100 UNIT/ML injection Inject 5-12 units into skin 3 times daily before meals.  Sliding scale = base of 5 units and increase by 1 unit for every 50. (150-200=6 units 200-250= 7 units 12/08/15  Yes Cheri FowlerKayla Rose, PA-C  Multiple Vitamin (MULTIVITAMIN WITH MINERALS) TABS tablet Take 1 tablet by  mouth daily.   Yes Historical Provider, MD  Multiple Vitamins-Minerals (HAIR SKIN AND NAILS FORMULA) TABS Take 1 tablet by mouth daily.   Yes Historical Provider, MD  benzonatate (TESSALON) 100 MG capsule Take 1 capsule (100 mg total) by mouth every 8 (eight) hours. Patient not taking: Reported on 12/08/2015 11/17/15   Garlon HatchetLisa M Sanders, PA-C  cetirizine (ZYRTEC ALLERGY) 10 MG tablet Take 1 tablet (10 mg total) by mouth daily. Patient not taking: Reported on 12/19/2015 11/17/15   Garlon HatchetLisa M Sanders, PA-C   BP 107/85 mmHg  Pulse 101  Temp(Src) 98.1 F (36.7 C) (Oral)  Resp 16  SpO2 99%  LMP 12/08/2015 Physical Exam  Constitutional: She is oriented to person, place, and time. She appears well-developed and well-nourished. She appears distressed.  Alert Glasgow Coma Score 15 appears mildly uncomfortable  HENT:  Head: Normocephalic and atraumatic.  Right Ear: External ear normal.  Left Ear: External ear normal.  Nose: Nose normal.  Mouth/Throat: Oropharynx is clear and moist.  Left forehead and eyelids left eye swollen reddened and warm and tender. Upon trying eyelids apart she has no redness of her eye. No diplopia. Extraocular muscles intact. She has mild pain with extraocular motion  Eyes: Conjunctivae are normal. Pupils are equal, round, and reactive to light.  Neck: Neck supple. No tracheal deviation present. No thyromegaly present.  Cardiovascular: Normal rate and regular rhythm.   No murmur heard. Pulmonary/Chest: Effort normal and breath sounds normal.  Abdominal: Soft. Bowel sounds are normal. She exhibits no distension. There is no tenderness.  Musculoskeletal: Normal range of motion. She exhibits no edema or tenderness.  Lymphadenopathy:    She has no cervical adenopathy.  Neurological: She is alert and oriented to person, place, and time. Coordination normal.  Skin: Skin is warm and dry. No rash noted.  Psychiatric: She has a normal mood and affect.  Nursing note and vitals  reviewed.   ED Course  Procedures (including critical care time) Labs Review Labs Reviewed - No data to display  Imaging Review Ct Head W Contrast  12/19/2015  CLINICAL DATA:  Initial valuation for acute scalp swelling.  Pain. EXAM: CT HEAD WITH CONTRAST TECHNIQUE: Contiguous axial images were obtained from the base of the skull through the vertex with intravenous contrast. CONTRAST:  80mL ISOVUE-300 IOPAMIDOL (ISOVUE-300) INJECTION 61% COMPARISON:  None. FINDINGS: There is no acute intracranial hemorrhage or infarct. No mass lesion or midline shift. Gray-white matter differentiation is well maintained. Ventricles are normal in size without evidence of hydrocephalus. CSF containing spaces are within normal limits. No extra-axial fluid collection. The calvarium is intact. Orbital soft tissues are within normal limits. The paranasal sinuses and mastoid air cells are well pneumatized and free of fluid. Soft tissue swelling present at the left frontal scalp. There is a small more focal phlegmonous hypodensity measuring approximately 7 mm just deep to the skin (series 4, image 43). This  is suspicious for phlegmon/ early abscess. No frank rim enhancing or drainable fluid collection identified. Scalp soft tissues otherwise unremarkable. IMPRESSION: 1. Soft tissue swelling involving the left frontal scalp, suspicious for cellulitis. A superimposed more focal 7 mm hypodensity within the subcutaneous fat of the left frontal scalp with overlying skin thickening is suspicious for phlegmon/early abscess. No other frank rim enhancing or drainable fluid collection identified. 2. No acute intracranial process identified. Electronically Signed   By: Rise Mu M.D.   On: 12/19/2015 21:39   I have personally reviewed and evaluated these images and lab results as part of my medical decision-making.   EKG Interpretation None     11:40 PM patient feels improved after treatment with intravenous fluids and  intravenous morphine. She is resting comfortably in bed. Her left eye is now partially open. Results for orders placed or performed during the hospital encounter of 12/20/15  CBC  Result Value Ref Range   WBC 9.1 4.0 - 10.5 K/uL   RBC 5.33 (H) 3.87 - 5.11 MIL/uL   Hemoglobin 13.7 12.0 - 15.0 g/dL   HCT 16.1 09.6 - 04.5 %   MCV 79.2 78.0 - 100.0 fL   MCH 25.7 (L) 26.0 - 34.0 pg   MCHC 32.5 30.0 - 36.0 g/dL   RDW 40.9 81.1 - 91.4 %   Platelets 239 150 - 400 K/uL  I-Stat Chem 8, ED  (not at [facility], [facility])  Result Value Ref Range   Sodium 138 135 - 145 mmol/L   Potassium 3.8 3.5 - 5.1 mmol/L   Chloride 99 (L) 101 - 111 mmol/L   BUN <3 (L) 6 - 20 mg/dL   Creatinine, Ser 7.82 0.44 - 1.00 mg/dL   Glucose, Bld 956 (H) 65 - 99 mg/dL   Calcium, Ion 2.13 0.86 - 1.23 mmol/L   TCO2 24 0 - 100 mmol/L   Hemoglobin 12.9 12.0 - 15.0 g/dL   HCT 57.8 46.9 - 62.9 %  I-Stat CG4 Lactic Acid, ED  Result Value Ref Range   Lactic Acid, Venous 2.08 (HH) 0.5 - 2.0 mmol/L   Comment NOTIFIED PHYSICIAN    Dg Chest 2 View  12/08/2015  CLINICAL DATA:  Chest pain and cough EXAM: CHEST  2 VIEW COMPARISON:  November 17, 2015 FINDINGS: Lungs are clear. Heart size and pulmonary vascularity are normal. No adenopathy. No pneumothorax. No bone lesions. IMPRESSION: No edema or consolidation. Electronically Signed   By: Bretta Bang III M.D.   On: 12/08/2015 15:31   Ct Head W Contrast  12/19/2015  CLINICAL DATA:  Initial valuation for acute scalp swelling.  Pain. EXAM: CT HEAD WITH CONTRAST TECHNIQUE: Contiguous axial images were obtained from the base of the skull through the vertex with intravenous contrast. CONTRAST:  80mL ISOVUE-300 IOPAMIDOL (ISOVUE-300) INJECTION 61% COMPARISON:  None. FINDINGS: There is no acute intracranial hemorrhage or infarct. No mass lesion or midline shift. Gray-white matter differentiation is well maintained. Ventricles are normal in size without evidence of hydrocephalus. CSF containing spaces  are within normal limits. No extra-axial fluid collection. The calvarium is intact. Orbital soft tissues are within normal limits. The paranasal sinuses and mastoid air cells are well pneumatized and free of fluid. Soft tissue swelling present at the left frontal scalp. There is a small more focal phlegmonous hypodensity measuring approximately 7 mm just deep to the skin (series 4, image 43). This is suspicious for phlegmon/ early abscess. No frank rim enhancing or drainable fluid collection identified. Scalp soft tissues otherwise  unremarkable. IMPRESSION: 1. Soft tissue swelling involving the left frontal scalp, suspicious for cellulitis. A superimposed more focal 7 mm hypodensity within the subcutaneous fat of the left frontal scalp with overlying skin thickening is suspicious for phlegmon/early abscess. No other frank rim enhancing or drainable fluid collection identified. 2. No acute intracranial process identified. Electronically Signed   By: Rise Mu M.D.   On: 12/19/2015 21:39   Ct Maxillofacial W/cm  12/20/2015  CLINICAL DATA:  25 year old female with swelling in the left highly. EXAM: CT MAXILLOFACIAL WITH CONTRAST TECHNIQUE: Multidetector CT imaging of the maxillofacial structures was performed with intravenous contrast. Multiplanar CT image reconstructions were also generated. A small metallic BB was placed on the right temple in order to reliably differentiate right from left. CONTRAST:  75mL ISOVUE-300 IOPAMIDOL (ISOVUE-300) INJECTION 61% COMPARISON:  CT dated 12/19/2015 FINDINGS: There is diffuse soft tissue swelling of the left periorbital region and left forehead area as well as soft tissue swelling of the nasal bridge. There has been interval increase in the soft tissue swelling since the prior study. No definite drainable fluid collection or abscess identified. There is no acute fracture or dislocation. The maxilla, mandible, and pterygoid plates are intact. The globes,  retro-orbital fat, and orbital walls are preserved. The visualized paranasal sinuses and mastoid air cells are clear. The visualized portions of the brain appear unremarkable. IMPRESSION: Interval progression of the inflammatory changes of the skin and subcutaneous soft tissues of the left forehead, and left periorbital region compatible with cellulitis. No drainable fluid collection/abscess. Electronically Signed   By: Elgie Collard M.D.   On: 12/20/2015 22:04    MDM  Code sepsis called based on Sirs criteria of tachycardia and elevated lactate. Source of infection is is cellulitis Final diagnoses:  None  I consulted with infectious disease Dr.Comer. In light of her multiple allergies, good antibiotic choice for her is Linezolid (Zyvox) I consulted with Dr Robb Matar, hospitalist who will see pt in the ed and arange for inpatient stay Dx #1 facial cellulitis #2 sepsis #3hyperglycemia    Doug Sou, MD 12/21/15 0002

## 2015-12-20 NOTE — Discharge Instructions (Signed)
Medications: Doxycycline, Norco  Treatment: Take doxycycline as prescribed for 10 days. Take Norco every 4-6 hours as needed for severe pain. Leave the bandage on your incision and drainage site until late in the day tomorrow. At which time, you can take the bandage off, wash the site with warm soapy water, and replace it with clean dressing.   Follow-up: Please follow-up with ENT as outlined on your discharge paperwork as soon as possible for further evaluation and and treatment. Please follow-up with primary care provider as soon as possible as well for follow-up of today's visit and further evaluation of your symptoms. Please return to emergency department if your symptoms are not improving or they're becoming worse, or if you develop any new or concerning symptoms, such as confusion, increasing headache, neck stiffness, high fevers.   Cellulitis Cellulitis is an infection of the skin and the tissue beneath it. The infected area is usually red and tender. Cellulitis occurs most often in the arms and lower legs.  CAUSES  Cellulitis is caused by bacteria that enter the skin through cracks or cuts in the skin. The most common types of bacteria that cause cellulitis are staphylococci and streptococci. SIGNS AND SYMPTOMS   Redness and warmth.  Swelling.  Tenderness or pain.  Fever. DIAGNOSIS  Your health care provider can usually determine what is wrong based on a physical exam. Blood tests may also be done. TREATMENT  Treatment usually involves taking an antibiotic medicine. HOME CARE INSTRUCTIONS   Take your antibiotic medicine as directed by your health care provider. Finish the antibiotic even if you start to feel better.  Keep the infected arm or leg elevated to reduce swelling.  Apply a warm cloth to the affected area up to 4 times per day to relieve pain.  Take medicines only as directed by your health care provider.  Keep all follow-up visits as directed by your health care  provider. SEEK MEDICAL CARE IF:   You notice red streaks coming from the infected area.  Your red area gets larger or turns dark in color.  Your bone or joint underneath the infected area becomes painful after the skin has healed.  Your infection returns in the same area or another area.  You notice a swollen bump in the infected area.  You develop new symptoms.  You have a fever. SEEK IMMEDIATE MEDICAL CARE IF:   You feel very sleepy.  You develop vomiting or diarrhea.  You have a general ill feeling (malaise) with muscle aches and pains.   This information is not intended to replace advice given to you by your health care provider. Make sure you discuss any questions you have with your health care provider.   Document Released: 05/03/2005 Document Revised: 04/14/2015 Document Reviewed: 10/09/2011 Elsevier Interactive Patient Education 2016 [employer]  Abscess An abscess is an infected area that contains a collection of pus and debris.It can occur in almost any part of the body. An abscess is also known as a furuncle or boil. CAUSES  An abscess occurs when tissue gets infected. This can occur from blockage of oil or sweat glands, infection of hair follicles, or a minor injury to the skin. As the body tries to fight the infection, pus collects in the area and creates pressure under the skin. This pressure causes pain. People with weakened immune systems have difficulty fighting infections and get certain abscesses more often.  SYMPTOMS Usually an abscess develops on the skin and becomes a painful mass that  is red, warm, and tender. If the abscess forms under the skin, you may feel a moveable soft area under the skin. Some abscesses break open (rupture) on their own, but most will continue to get worse without care. The infection can spread deeper into the body and eventually into the bloodstream, causing you to feel ill.  DIAGNOSIS  Your caregiver will take your medical  history and perform a physical exam. A sample of fluid may also be taken from the abscess to determine what is causing your infection. TREATMENT  Your caregiver may prescribe antibiotic medicines to fight the infection. However, taking antibiotics alone usually does not cure an abscess. Your caregiver may need to make a small cut (incision) in the abscess to drain the pus. In some cases, gauze is packed into the abscess to reduce pain and to continue draining the area. HOME CARE INSTRUCTIONS   Only take over-the-counter or prescription medicines for pain, discomfort, or fever as directed by your caregiver.  If you were prescribed antibiotics, take them as directed. Finish them even if you start to feel better.  If gauze is used, follow your caregiver's directions for changing the gauze.  To avoid spreading the infection:  Keep your draining abscess covered with a bandage.  Wash your hands well.  Do not share personal care items, towels, or whirlpools with others.  Avoid skin contact with others.  Keep your skin and clothes clean around the abscess.  Keep all follow-up appointments as directed by your caregiver. SEEK MEDICAL CARE IF:   You have increased pain, swelling, redness, fluid drainage, or bleeding.  You have muscle aches, chills, or a general ill feeling.  You have a fever. MAKE SURE YOU:   Understand these instructions.  Will watch your condition.  Will get help right away if you are not doing well or get worse.   This information is not intended to replace advice given to you by your health care provider. Make sure you discuss any questions you have with your health care provider.   Document Released: 05/03/2005 Document Revised: 01/23/2012 Document Reviewed: 10/06/2011 Elsevier Interactive Patient Education [employer]

## 2015-12-20 NOTE — ED Notes (Signed)
Spoke with JC in pharmacy, next antibiotic due at 8:00am. Current overdue med is [facility] duplicate [facility]

## 2015-12-21 ENCOUNTER — Encounter (HOSPITAL_COMMUNITY): Payer: Self-pay | Admitting: Internal Medicine

## 2015-12-21 DIAGNOSIS — J45909 Unspecified asthma, uncomplicated: Secondary | ICD-10-CM | POA: Diagnosis present

## 2015-12-21 DIAGNOSIS — E109 Type 1 diabetes mellitus without complications: Secondary | ICD-10-CM | POA: Diagnosis present

## 2015-12-21 DIAGNOSIS — E1065 Type 1 diabetes mellitus with hyperglycemia: Secondary | ICD-10-CM

## 2015-12-21 DIAGNOSIS — H05019 Cellulitis of unspecified orbit: Secondary | ICD-10-CM | POA: Insufficient documentation

## 2015-12-21 DIAGNOSIS — L03211 Cellulitis of face: Secondary | ICD-10-CM | POA: Insufficient documentation

## 2015-12-21 DIAGNOSIS — Z889 Allergy status to unspecified drugs, medicaments and biological substances status: Secondary | ICD-10-CM | POA: Insufficient documentation

## 2015-12-21 LAB — CBC WITH DIFFERENTIAL/PLATELET
BASOS PCT: 0 %
Basophils Absolute: 0 10*3/uL (ref 0.0–0.1)
Basophils Absolute: 0 10*3/uL (ref 0.0–0.1)
Basophils Relative: 1 %
EOS ABS: 0.1 10*3/uL (ref 0.0–0.7)
EOS PCT: 1 %
Eosinophils Absolute: 0.2 10*3/uL (ref 0.0–0.7)
Eosinophils Relative: 2 %
HCT: 38.3 % (ref 36.0–46.0)
HEMATOCRIT: 35.4 % — AB (ref 36.0–46.0)
HEMOGLOBIN: 12.6 g/dL (ref 12.0–15.0)
Hemoglobin: 11.3 g/dL — ABNORMAL LOW (ref 12.0–15.0)
LYMPHS ABS: 3.1 10*3/uL (ref 0.7–4.0)
LYMPHS ABS: 3.1 10*3/uL (ref 0.7–4.0)
LYMPHS PCT: 39 %
Lymphocytes Relative: 33 %
MCH: 25.6 pg — AB (ref 26.0–34.0)
MCH: 25.8 pg — AB (ref 26.0–34.0)
MCHC: 31.9 g/dL (ref 30.0–36.0)
MCHC: 32.9 g/dL (ref 30.0–36.0)
MCV: 78.3 fL (ref 78.0–100.0)
MCV: 80.3 fL (ref 78.0–100.0)
MONO ABS: 0.9 10*3/uL (ref 0.1–1.0)
MONOS PCT: 12 %
MONOS PCT: 9 %
Monocytes Absolute: 0.9 10*3/uL (ref 0.1–1.0)
NEUTROS ABS: 3.7 10*3/uL (ref 1.7–7.7)
NEUTROS PCT: 57 %
Neutro Abs: 5.3 10*3/uL (ref 1.7–7.7)
Neutrophils Relative %: 46 %
PLATELETS: 251 10*3/uL (ref 150–400)
Platelets: 201 10*3/uL (ref 150–400)
RBC: 4.41 MIL/uL (ref 3.87–5.11)
RBC: 4.89 MIL/uL (ref 3.87–5.11)
RDW: 13.4 % (ref 11.5–15.5)
RDW: 13.5 % (ref 11.5–15.5)
WBC: 7.9 10*3/uL (ref 4.0–10.5)
WBC: 9.4 10*3/uL (ref 4.0–10.5)

## 2015-12-21 LAB — URINALYSIS, ROUTINE W REFLEX MICROSCOPIC
Bilirubin Urine: NEGATIVE
Glucose, UA: >1000 mg/dL — AB
Hgb urine dipstick: NEGATIVE
KETONES UR: NEGATIVE mg/dL
LEUKOCYTES UA: NEGATIVE
NITRITE: NEGATIVE
PH: 6 (ref 5.0–8.0)
PROTEIN: NEGATIVE mg/dL
Specific Gravity, Urine: >1.046 — ABNORMAL HIGH (ref 1.005–1.030)

## 2015-12-21 LAB — COMPREHENSIVE METABOLIC PANEL
ALBUMIN: 3.5 g/dL (ref 3.5–5.0)
ALK PHOS: 89 U/L (ref 38–126)
ALT: 13 U/L — ABNORMAL LOW (ref 14–54)
ANION GAP: 8 (ref 5–15)
AST: 16 U/L (ref 15–41)
BUN: 5 mg/dL — ABNORMAL LOW (ref 6–20)
CALCIUM: 8.7 mg/dL — AB (ref 8.9–10.3)
CO2: 28 mmol/L (ref 22–32)
Chloride: 104 mmol/L (ref 101–111)
Creatinine, Ser: 0.61 mg/dL (ref 0.44–1.00)
GFR calc Af Amer: >60 mL/min (ref >60)
GFR calc non Af Amer: >60 mL/min (ref >60)
GLUCOSE: 177 mg/dL — AB (ref 65–99)
POTASSIUM: 3.3 mmol/L — AB (ref 3.5–5.1)
SODIUM: 140 mmol/L (ref 135–145)
Total Bilirubin: 0.3 mg/dL (ref 0.3–1.2)
Total Protein: 6.4 g/dL — ABNORMAL LOW (ref 6.5–8.1)

## 2015-12-21 LAB — URINE MICROSCOPIC-ADD ON
Bacteria, UA: NONE SEEN
RBC / HPF: NONE SEEN RBC/hpf (ref 0–5)
WBC UA: NONE SEEN WBC/hpf (ref 0–5)

## 2015-12-21 LAB — GLUCOSE, CAPILLARY
GLUCOSE-CAPILLARY: 278 mg/dL — AB (ref 65–99)
GLUCOSE-CAPILLARY: 408 mg/dL — AB (ref 65–99)
Glucose-Capillary: 307 mg/dL — ABNORMAL HIGH (ref 65–99)
Glucose-Capillary: 166 mg/dL — ABNORMAL HIGH (ref 65–99)
Glucose-Capillary: 65 mg/dL (ref 65–99)
Glucose-Capillary: 73 mg/dL (ref 65–99)

## 2015-12-21 LAB — MAGNESIUM: MAGNESIUM: 1.7 mg/dL (ref 1.7–2.4)

## 2015-12-21 LAB — I-STAT CG4 LACTIC ACID, ED: Lactic Acid, Venous: 1.64 mmol/L (ref 0.5–2.0)

## 2015-12-21 MED ORDER — HYDROCODONE-ACETAMINOPHEN 5-325 MG PO TABS
1.0000 | ORAL_TABLET | ORAL | Status: DC | PRN
  Administered 2015-12-22 (×2): 1 via ORAL
  Filled 2015-12-21 (×2): qty 1

## 2015-12-21 MED ORDER — MORPHINE SULFATE (PF) 4 MG/ML IV SOLN
4.0000 mg | INTRAVENOUS | Status: DC | PRN
  Administered 2015-12-21 (×3): 4 mg via INTRAVENOUS
  Filled 2015-12-21 (×3): qty 1

## 2015-12-21 MED ORDER — KETOROLAC TROMETHAMINE 30 MG/ML IJ SOLN
30.0000 mg | Freq: Four times a day (QID) | INTRAMUSCULAR | Status: DC | PRN
  Administered 2015-12-21 – 2015-12-22 (×3): 30 mg via INTRAVENOUS
  Filled 2015-12-21 (×3): qty 1

## 2015-12-21 MED ORDER — ONDANSETRON HCL 4 MG PO TABS: 4.0000 mg | ORAL_TABLET | Freq: Four times a day (QID) | ORAL | Status: DC | PRN

## 2015-12-21 MED ORDER — INSULIN ASPART 100 UNIT/ML ~~LOC~~ SOLN: 0.0000 [IU] | Freq: Three times a day (TID) | SUBCUTANEOUS | Status: DC

## 2015-12-21 MED ORDER — ADULT MULTIVITAMIN W/MINERALS CH
1.0000 | ORAL_TABLET | Freq: Every day | ORAL | Status: DC
  Administered 2015-12-21 – 2015-12-22 (×2): 1 via ORAL
  Filled 2015-12-21 (×2): qty 1

## 2015-12-21 MED ORDER — INSULIN ASPART 100 UNIT/ML ~~LOC~~ SOLN
0.0000 [IU] | Freq: Three times a day (TID) | SUBCUTANEOUS | Status: DC
  Administered 2015-12-21: 3 [IU] via SUBCUTANEOUS
  Filled 2015-12-21: qty 1

## 2015-12-21 MED ORDER — ONDANSETRON HCL 4 MG/2ML IJ SOLN: 4.0000 mg | Freq: Four times a day (QID) | INTRAMUSCULAR | Status: DC | PRN

## 2015-12-21 MED ORDER — POTASSIUM CHLORIDE CRYS ER 20 MEQ PO TBCR
20.0000 meq | EXTENDED_RELEASE_TABLET | Freq: Once | ORAL | Status: AC
  Administered 2015-12-21: 20 meq via ORAL
  Filled 2015-12-21: qty 1

## 2015-12-21 MED ORDER — POTASSIUM CHLORIDE IN NACL 20-0.9 MEQ/L-% IV SOLN: INTRAVENOUS | Status: DC

## 2015-12-21 MED ORDER — INSULIN GLARGINE 100 UNIT/ML ~~LOC~~ SOLN
15.0000 [IU] | Freq: Two times a day (BID) | SUBCUTANEOUS | Status: DC
  Administered 2015-12-21 – 2015-12-22 (×3): 15 [IU] via SUBCUTANEOUS
  Filled 2015-12-21 (×4): qty 0.15

## 2015-12-21 MED ORDER — INSULIN ASPART 100 UNIT/ML ~~LOC~~ SOLN
5.0000 [IU] | Freq: Three times a day (TID) | SUBCUTANEOUS | Status: DC
  Administered 2015-12-22 (×2): 5 [IU] via SUBCUTANEOUS
  Filled 2015-12-21 (×2): qty 1

## 2015-12-21 MED ORDER — LINEZOLID 600 MG PO TABS
600.0000 mg | ORAL_TABLET | Freq: Two times a day (BID) | ORAL | Status: DC
  Administered 2015-12-21 – 2015-12-22 (×2): 600 mg via ORAL
  Filled 2015-12-21 (×3): qty 1

## 2015-12-21 MED ORDER — KETOROLAC TROMETHAMINE 30 MG/ML IJ SOLN
30.0000 mg | Freq: Once | INTRAMUSCULAR | Status: AC
  Administered 2015-12-21: 30 mg via INTRAVENOUS
  Filled 2015-12-21: qty 1

## 2015-12-21 MED ORDER — INSULIN ASPART 100 UNIT/ML ~~LOC~~ SOLN
10.0000 [IU] | Freq: Once | SUBCUTANEOUS | Status: AC
  Administered 2015-12-21: 10 [IU] via SUBCUTANEOUS

## 2015-12-21 MED ORDER — INSULIN ASPART 100 UNIT/ML ~~LOC~~ SOLN
0.0000 [IU] | Freq: Every day | SUBCUTANEOUS | Status: DC
  Administered 2015-12-22: 5 [IU] via SUBCUTANEOUS

## 2015-12-21 MED ORDER — ACETAMINOPHEN 325 MG PO TABS: 650.0000 mg | ORAL_TABLET | Freq: Four times a day (QID) | ORAL | Status: DC | PRN

## 2015-12-21 MED ORDER — NICOTINE 14 MG/24HR TD PT24
14.0000 mg | MEDICATED_PATCH | Freq: Every day | TRANSDERMAL | Status: DC | PRN
  Administered 2015-12-21: 14 mg via TRANSDERMAL
  Filled 2015-12-21 (×2): qty 1

## 2015-12-21 MED ORDER — ENOXAPARIN SODIUM 40 MG/0.4ML ~~LOC~~ SOLN
40.0000 mg | Freq: Every day | SUBCUTANEOUS | Status: DC
  Administered 2015-12-21: 40 mg via SUBCUTANEOUS
  Filled 2015-12-21: qty 0.4

## 2015-12-21 MED ORDER — INSULIN ASPART 100 UNIT/ML ~~LOC~~ SOLN
0.0000 [IU] | Freq: Three times a day (TID) | SUBCUTANEOUS | Status: DC
  Administered 2015-12-21: 8 [IU] via SUBCUTANEOUS
  Administered 2015-12-22 (×2): 5 [IU] via SUBCUTANEOUS
  Filled 2015-12-21 (×3): qty 1

## 2015-12-21 MED ORDER — BISMUTH SUBSALICYLATE 262 MG/15ML PO SUSP
30.0000 mL | Freq: Four times a day (QID) | ORAL | Status: DC | PRN
  Filled 2015-12-21: qty 118

## 2015-12-21 MED ORDER — PANTOPRAZOLE SODIUM 40 MG PO TBEC
40.0000 mg | DELAYED_RELEASE_TABLET | Freq: Every day | ORAL | Status: DC
  Administered 2015-12-22: 40 mg via ORAL
  Filled 2015-12-21 (×2): qty 1

## 2015-12-21 MED ORDER — ALBUTEROL SULFATE (2.5 MG/3ML) 0.083% IN NEBU: 2.5000 mg | INHALATION_SOLUTION | Freq: Four times a day (QID) | RESPIRATORY_TRACT | Status: DC | PRN

## 2015-12-21 MED ORDER — MORPHINE SULFATE (PF) 4 MG/ML IV SOLN
4.0000 mg | Freq: Once | INTRAVENOUS | Status: AC
  Administered 2015-12-21: 4 mg via INTRAVENOUS
  Filled 2015-12-21: qty 1

## 2015-12-21 MED ORDER — POTASSIUM CHLORIDE IN NACL 20-0.9 MEQ/L-% IV SOLN
INTRAVENOUS | Status: DC
  Administered 2015-12-21 (×2): via INTRAVENOUS
  Filled 2015-12-21 (×4): qty 1000

## 2015-12-21 NOTE — H&P (Signed)
History and Physical    Molly Benson AVW:[id number] DOB: Dec 15, 1990 DOA: 12/20/2015  PCP: Julieanne Manson, MD   Patient coming from: Home.  Chief Complaint: Left thigh swelling.  HPI: [name] is a 25 y.o. female with medical history significant of type 1 diabetes, asthma as a child and teenager, anxiety, PTSD who returns to the emergency department after presenting yesterday with left periorbital tenderness with surrounding erythema and edema that did not respond to outpatient therapy.  Per patient, about 2 weeks ago, she noticed "a pimple" in her left frontal hairline area. She states, that she usually gets these on her skin often and they usually resolve spontaneously without any significant symptoms. However, over the past few days, the patient states that she has been having night sweats, chills, fatigue and malaise. She has had subjective fever.  She came yesterday to the emergency department and was discharged with prescriptions of doxycycline and 5/325 Norco as needed for pain. However, she states that when she got home, she vomited twice, has not felt well today and when she woke up earlier in the day, she noticed that her left-side of her face was significantly more painful, tender in erythematosus. She was unable to open her left eye. So she decided to return to the emergency department.   ED Course: The patient received IV fluids, analgesics and IV Zyvox which she stated has provided relief. She is able to open her left eye now. Workup shows glucosuria, hyperglycemia, mild lactic acidosis and a CT scan of the maxillofacial area with contrast showing inflammatory changes of the skin and subcutaneous tissues of the left for her at and periorbital area without abscess.   Review of Systems: As per HPI otherwise 10 point review of systems negative.   Past Medical History  Diagnosis Date  . Diabetes mellitus (HCC)   . Asthma   . Anxiety   . PTSD (post-traumatic stress  disorder)     Past Surgical History  Procedure Laterality Date  . Cesarean section      x 2.  . Appendectomy    . Dilation and curettage of uterus    . Eyelid  surgery       reports that she has been smoking Cigarettes.  She has a 1.2 pack-year smoking history. She has never used smokeless tobacco. She reports that she drinks alcohol. She reports that she does not use illicit drugs.  Allergies  Allergen Reactions  . Amoxicillin Anaphylaxis, Swelling and Rash    Has patient had a PCN reaction causing immediate rash, facial/tongue/throat swelling, SOB or lightheadedness with hypotension: Yes Has patient had a PCN reaction causing severe rash involving mucus membranes or skin necrosis: No Has patient had a PCN reaction that required hospitalization Yes Has patient had a PCN reaction occurring within the last 10 years: Yes If all of the above answers are "NO", then may proceed with Cephalosporin use.   Marland Kitchen Cleocin [Clindamycin Hcl] Anaphylaxis and Swelling  . Sulfa Antibiotics Anaphylaxis and Swelling  . Vancomycin Anaphylaxis and Swelling  . Zithromax [Azithromycin] Anaphylaxis and Swelling    Family History  Problem Relation Age of Onset  . Hypertension Mother   . GER disease Father   . Asthma Brother     Prior to Admission medications   Medication Sig Start Date End Date Taking? Authorizing Provider  acetaminophen (TYLENOL) 500 MG tablet Take 1,000 mg by mouth every 6 (six) hours as needed for moderate pain or headache.   Yes Historical Provider, MD  bismuth subsalicylate (PEPTO BISMOL) 262 MG/15ML suspension Take 30 mLs by mouth every 6 (six) hours as needed for indigestion.   Yes Historical Provider, MD  Caffeine-Magnesium Salicylate (DIUREX PO) Take 1 capsule by mouth daily as needed (for retaining water).   Yes Historical Provider, MD  doxycycline (VIBRAMYCIN) 100 MG capsule Take 1 capsule (100 mg total) by mouth 2 (two) times daily. 12/20/15  Yes Waylan Boga Law, PA-C    glucose blood test strip Use as instructed 12/08/15  Yes Cheri Fowler, PA-C  HYDROcodone-acetaminophen (NORCO/VICODIN) 5-325 MG tablet Take 2 tablets by mouth every 4 (four) hours as needed. Patient taking differently: Take 2 tablets by mouth every 4 (four) hours as needed for moderate pain or severe pain.  12/20/15  Yes Alexandra M Law, PA-C  ibuprofen (ADVIL,MOTRIN) 200 MG tablet Take 600 mg by mouth every 6 (six) hours as needed for headache or moderate pain.    Yes Historical Provider, MD  insulin glargine (LANTUS) 100 UNIT/ML injection Inject 0.15 mLs (15 Units total) into the skin 2 (two) times daily. 12/08/15  Yes Kayla Rose, PA-C  insulin lispro (HUMALOG) 100 UNIT/ML injection Inject 5-12 units into skin 3 times daily before meals.  Sliding scale = base of 5 units and increase by 1 unit for every 50. (150-200=6 units 200-250= 7 units 12/08/15  Yes Cheri Fowler, PA-C  Multiple Vitamin (MULTIVITAMIN WITH MINERALS) TABS tablet Take 1 tablet by mouth daily.   Yes Historical Provider, MD  Multiple Vitamins-Minerals (HAIR SKIN AND NAILS FORMULA) TABS Take 1 tablet by mouth daily.   Yes Historical Provider, MD  benzonatate (TESSALON) 100 MG capsule Take 1 capsule (100 mg total) by mouth every 8 (eight) hours. Patient not taking: Reported on 12/08/2015 11/17/15   Garlon Hatchet, PA-C  cetirizine (ZYRTEC ALLERGY) 10 MG tablet Take 1 tablet (10 mg total) by mouth daily. Patient not taking: Reported on 12/19/2015 11/17/15   Garlon Hatchet, PA-C    Physical Exam: Filed Vitals:   12/20/15 1744 12/20/15 1957 12/20/15 2208  BP: 107/85 105/76 106/80  Pulse: 101 94 76  Temp: 98.1 F (36.7 C)    TempSrc: Oral    Resp: 16 18 18   SpO2: 99% 100% 99%      Constitutional: NAD, calm, comfortable Filed Vitals:   12/20/15 1744 12/20/15 1957 12/20/15 2208  BP: 107/85 105/76 106/80  Pulse: 101 94 76  Temp: 98.1 F (36.7 C)    TempSrc: Oral    Resp: 16 18 18   SpO2: 99% 100% 99%   Eyes: PERRL, Left periorbital  tenderness with surrounding erythema and edema. ENMT: Mucous membranes are moist. Posterior pharynx clear of any exudate or lesions.Normal dentition.  Neck: normal, supple, no masses, no thyromegaly Respiratory: clear to auscultation bilaterally, no wheezing, no crackles. Normal respiratory effort. No accessory muscle use.  Cardiovascular: Regular rate and rhythm, no murmurs / rubs / gallops. No extremity edema. 2+ pedal pulses. No carotid bruits.  Abdomen: no tenderness, no masses palpated. No hepatosplenomegaly. Bowel sounds positive.  Musculoskeletal: no clubbing / cyanosis. No joint deformity upper and lower extremities. Good ROM, no contractures. Normal muscle tone.  Skin: Positive erythema, tenderness and edema around left periorbital area. Positive healing wound on left frontal hairline area Neurologic: CN 2-12 grossly intact. Sensation intact, DTR normal. Strength 5/5 in all 4.  Psychiatric: Normal judgment and insight. Alert and oriented x 3. Normal mood.    Labs on Admission: I have personally reviewed following labs and imaging studies  CBC:  Recent Labs Lab 12/19/15 1811 12/20/15 1929 12/20/15 2058  WBC 10.1 9.1  --   NEUTROABS 6.8  --   --   HGB 13.4 13.7 12.9  HCT 41.3 42.2 38.0  MCV 77.9* 79.2  --   PLT 284 239  --    Basic Metabolic Panel:  Recent Labs Lab 12/19/15 1811 12/20/15 2058  NA 136 138  K 3.7 3.8  CL 103 99*  CO2 26  --   GLUCOSE 101* 328*  BUN 11 <3*  CREATININE 0.59 0.50  CALCIUM 9.4  --    GFR: Estimated Creatinine Clearance: 88 mL/min (by C-G formula based on Cr of 0.5). Liver Function Tests:  Recent Labs Lab 12/19/15 1811  AST 14*  ALT 17  ALKPHOS 98  BILITOT 0.3  PROT 7.8  ALBUMIN 4.4    Recent Labs Lab 12/19/15 1811  LIPASE 17   CBG:  Recent Labs Lab 12/19/15 1758 12/19/15 2046 12/20/15 2359  GLUCAP 107* 73 229*   Urine analysis:    Component Value Date/Time   COLORURINE YELLOW 12/19/2015 2010   APPEARANCEUR  CLEAR 12/19/2015 2010   LABSPEC 1.010 12/19/2015 2010   PHURINE 6.0 12/19/2015 2010   GLUCOSEU NEGATIVE 12/19/2015 2010   HGBUR NEGATIVE 12/19/2015 2010   BILIRUBINUR NEGATIVE 12/19/2015 2010   KETONESUR NEGATIVE 12/19/2015 2010   PROTEINUR NEGATIVE 12/19/2015 2010   UROBILINOGEN 1.0 05/12/2015 1155   NITRITE NEGATIVE 12/19/2015 2010   LEUKOCYTESUR NEGATIVE 12/19/2015 2010    Radiological Exams on Admission: Ct Head W Contrast  12/19/2015  CLINICAL DATA:  Initial valuation for acute scalp swelling.  Pain. EXAM: CT HEAD WITH CONTRAST TECHNIQUE: Contiguous axial images were obtained from the base of the skull through the vertex with intravenous contrast. CONTRAST:  80mL ISOVUE-300 IOPAMIDOL (ISOVUE-300) INJECTION 61% COMPARISON:  None. FINDINGS: There is no acute intracranial hemorrhage or infarct. No mass lesion or midline shift. Gray-white matter differentiation is well maintained. Ventricles are normal in size without evidence of hydrocephalus. CSF containing spaces are within normal limits. No extra-axial fluid collection. The calvarium is intact. Orbital soft tissues are within normal limits. The paranasal sinuses and mastoid air cells are well pneumatized and free of fluid. Soft tissue swelling present at the left frontal scalp. There is a small more focal phlegmonous hypodensity measuring approximately 7 mm just deep to the skin (series 4, image 43). This is suspicious for phlegmon/ early abscess. No frank rim enhancing or drainable fluid collection identified. Scalp soft tissues otherwise unremarkable. IMPRESSION: 1. Soft tissue swelling involving the left frontal scalp, suspicious for cellulitis. A superimposed more focal 7 mm hypodensity within the subcutaneous fat of the left frontal scalp with overlying skin thickening is suspicious for phlegmon/early abscess. No other frank rim enhancing or drainable fluid collection identified. 2. No acute intracranial process identified. Electronically  Signed   By: Rise MuBenjamin  McClintock M.D.   On: 12/19/2015 21:39   Ct Maxillofacial W/cm  12/20/2015  CLINICAL DATA:  25 year old female with swelling in the left highly. EXAM: CT MAXILLOFACIAL WITH CONTRAST TECHNIQUE: Multidetector CT imaging of the maxillofacial structures was performed with intravenous contrast. Multiplanar CT image reconstructions were also generated. A small metallic BB was placed on the right temple in order to reliably differentiate right from left. CONTRAST:  75mL ISOVUE-300 IOPAMIDOL (ISOVUE-300) INJECTION 61% COMPARISON:  CT dated 12/19/2015 FINDINGS: There is diffuse soft tissue swelling of the left periorbital region and left forehead area as well as soft tissue swelling of the nasal  bridge. There has been interval increase in the soft tissue swelling since the prior study. No definite drainable fluid collection or abscess identified. There is no acute fracture or dislocation. The maxilla, mandible, and pterygoid plates are intact. The globes, retro-orbital fat, and orbital walls are preserved. The visualized paranasal sinuses and mastoid air cells are clear. The visualized portions of the brain appear unremarkable. IMPRESSION: Interval progression of the inflammatory changes of the skin and subcutaneous soft tissues of the left forehead, and left periorbital region compatible with cellulitis. No drainable fluid collection/abscess. Electronically Signed   By: Elgie Collard M.D.   On: 12/20/2015 22:04      Assessment/Plan Principal Problem:   Cellulitis of face Admit to MedSurg/inpatient. Continue IV fluids. Analgesics as needed. Continue Zyvox as recommended by ID. Reconsult infectious diseases if no improvement.  Active Problems:   Type 1 diabetes mellitus (HCC) Carbohydrate modified diet. Continue Lantus 15 units SQ twice a day. CBG monitoring with regular insulin sliding scale.    Asthma Per patient, she has not had an asthma attack since she was in high  school. However, she smokes about half pack of cigarettes a day. Will order albuterol as needed via nebulizer if symptoms occur.    DVT prophylaxis: Lovenox. Code Status: Full. Family Communication:  Disposition Plan: Admit for IV antibiotic therapy for 2-3 days. Consults called: ID was unofficially consulted by Dr. Felix Pacini Admission status: MedSurg/inpatient   Bobette Mo MD [facility] Pager [phone].  If 7PM-7AM, please contact night-coverage [email or web address] Password [email or web address]  12/21/2015, 12:09 AM

## 2015-12-21 NOTE — ED Notes (Signed)
Provider in room: Sanda Kleindavid ortiz md verbalized no need for two IVs at this time.

## 2015-12-21 NOTE — Progress Notes (Signed)
CBG: 65  Treatment: 15 GM carbohydrate snack  Symptoms: None  Follow-up CBG: Time 1738 CBG Result: 73 (Patient ordering dinner now)  Possible Reasons for Event: Unknown

## 2015-12-21 NOTE — Progress Notes (Signed)
Inpatient Diabetes Program Recommendations  AACE/ADA: New Consensus Statement on Inpatient Glycemic Control (2015)  Target Ranges:  Prepandial:   less than 140 mg/dL      Peak postprandial:   less than 180 mg/dL (1-2 hours)      Critically ill patients:  140 - 180 mg/dL   Review of Glycemic Control Results for Molly Benson, Molly Benson (MRN [id number]) as of 12/21/2015 16:17  Ref. Range 12/20/2015 23:59 12/21/2015 01:53 12/21/2015 07:34 12/21/2015 11:40  Glucose-Capillary Latest Ref Range: 65-99 mg/dL 409229 (H) 811307 (H) 914166 (H) 278 (H)   Needs meal coverage insulin. Eating 100%. On Humalog 5-12 units tidwc at home. Need tight glycemic control for healing.   Inpatient Diabetes Program Recommendations:    Consider addition of Novolog 5 units tidwc for meal coverage insulin. HgbA1C to assess glycemic control prior to hospitalization.  Will continue to follow. Thank you. Ailene Ardshonda Quamere Mussell, RD, LDN, CDE Inpatient Diabetes Coordinator [phone]

## 2015-12-21 NOTE — Consult Note (Signed)
Date of Admission:  12/20/2015  Date of Consult:  12/21/2015  Reason for Consult: facial and orbital cellulitis in patient with multiple antibiotic allergies Referring Physician: Dr. Carles Collet   HPI: Molly Benson is an 25 y.o. female. type 1 diabetes, asthma as a child and teenager, anxiety, PTSD who returns to the emergency department after presenting yesterday with left periorbital tenderness with surrounding erythema and edema that did not respond to outpatient therapy.  Per patient, about 2 weeks ago, she noticed "a pimple" in her left frontal hairline area. She states, that she usually gets these on her skin often and they usually resolve spontaneously without any significant symptoms. However, over the past few days, the patient states that she has been having night sweats, chills, fatigue and malaise. She has had subjective fever.  She came Sunday to the emergency department and was discharged with prescriptions of doxycycline and 5/325 Norco as needed for pain. However, she states that when she got home, she vomited twice, has not felt well y and when she woke up earlier in the day of admission, she noticed that her left-side of her face was significantly more painful, tender in erythematosus. She was unable to open her left eye. So she decided to return to the emergency department.  She had CT of the MF which did not show any abscess.  She was started on zyvox and has improved.  She has multiple allergies claiming that several different classes of antibiotics caused a full body rash and her to have difficulty breathing. This occurred in [place] when she had been admitted and treated for a different infection. It sounds as if these antibiotics were used in sequence and or simultaneously so it is possible she was not having an allergic reaction to each. Ultimately obtaining the records would be helpful.     Past Medical History  Diagnosis Date  . Diabetes mellitus ([place])   .  Asthma   . Anxiety   . PTSD (post-traumatic stress disorder)     Past Surgical History  Procedure Laterality Date  . Cesarean section      x 2.  . Appendectomy    . Dilation and curettage of uterus    . Eyelid  surgery      Social History:  reports that she has been smoking Cigarettes.  She has a 1.2 pack-year smoking history. She has never used smokeless tobacco. She reports that she drinks alcohol. She reports that she does not use illicit drugs.   Family History  Problem Relation Age of Onset  . Hypertension Mother   . GER disease Father   . Asthma Brother   . Stroke Maternal Grandfather   . CAD Maternal Grandfather   . Hypertension Maternal Grandfather   . Stroke Paternal Grandfather   . CAD Paternal Grandfather   . Cancer Paternal Grandfather   . Scoliosis Paternal Grandmother   . Hypertension Paternal Grandmother   . Rheum arthritis Maternal Grandmother   . Fibromyalgia Maternal Grandmother     Allergies  Allergen Reactions  . Amoxicillin Anaphylaxis, Swelling and Rash    Has patient had a PCN reaction causing immediate rash, facial/tongue/throat swelling, SOB or lightheadedness with hypotension: Yes Has patient had a PCN reaction causing severe rash involving mucus membranes or skin necrosis: No Has patient had a PCN reaction that required hospitalization Yes Has patient had a PCN reaction occurring within the last 10 years: Yes If all of the above answers are "NO",  then may proceed with Cephalosporin use.   Marland Kitchen Cleocin [Clindamycin Hcl] Anaphylaxis and Swelling  . Sulfa Antibiotics Anaphylaxis and Swelling  . Vancomycin Anaphylaxis and Swelling  . Zithromax [Azithromycin] Anaphylaxis and Swelling     Medications: I have reviewed patients current medications as documented in Epic Anti-infectives    Start     Dose/Rate Route Frequency Ordered Stop   12/20/15 2200  linezolid (ZYVOX) IVPB 600 mg     600 mg 300 mL/hr over 60 Minutes Intravenous Every 12 hours  12/20/15 1936           ROS: as in HPI otherwise remainder of 12 point Review of Systems is negative  Blood pressure 89/56, pulse 82, temperature 98.3 F (36.8 C), temperature source Oral, resp. rate 14, height 5' 1"  (1.549 m), weight 130 lb (58.968 kg), last menstrual period 12/08/2015, SpO2 99 %. General: Alert and awake, oriented x3, not in any acute distress. HEENT: anicteric sclera,  EOMI, oropharynx clear and without exudate  She has area where she has excoriation on forehead, orbital edema  12/21/15:       Cardiovascular: egular rate, normal r,  no murmur rubs or gallops Pulmonary:  no wheezing, resp distress Gastrointestinal: soft nontender, nondistended, normal bowel sounds, Musculoskeletal: no  clubbing or edema noted bilaterally Skin, soft tissue: no rashes Neuro: nonfocal   Results for orders placed or performed during the hospital encounter of 12/20/15 (from the past 48 hour(s))  Culture, blood (routine x 2)     Status: None (Preliminary result)   Collection Time: 12/20/15  7:25 PM  Result Value Ref Range   Specimen Description RIGHT ANTECUBITAL    Special Requests BOTTLES DRAWN AEROBIC AND ANAEROBIC 5CC    Culture      NO GROWTH < 24 HOURS Performed at [facility]    Report Status PENDING   CBC     Status: Abnormal   Collection Time: 12/20/15  7:29 PM  Result Value Ref Range   WBC 9.1 4.0 - 10.5 K/uL   RBC 5.33 (H) 3.87 - 5.11 MIL/uL   Hemoglobin 13.7 12.0 - 15.0 g/dL   HCT 42.2 36.0 - 46.0 %   MCV 79.2 78.0 - 100.0 fL   MCH 25.7 (L) 26.0 - 34.0 pg   MCHC 32.5 30.0 - 36.0 g/dL   RDW 13.5 11.5 - 15.5 %   Platelets 239 150 - 400 K/uL  I-Stat Chem 8, ED  (not at [facility])     Status: Abnormal   Collection Time: 12/20/15  8:58 PM  Result Value Ref Range   Sodium 138 135 - 145 mmol/L   Potassium 3.8 3.5 - 5.1 mmol/L   Chloride 99 (L) 101 - 111 mmol/L   BUN <3 (L) 6 - 20 mg/dL   Creatinine, Ser 0.50 0.44 - 1.00 mg/dL   Glucose, Bld 328 (H)  65 - 99 mg/dL   Calcium, Ion 1.13 1.12 - 1.23 mmol/L   TCO2 24 0 - 100 mmol/L   Hemoglobin 12.9 12.0 - 15.0 g/dL   HCT 38.0 36.0 - 46.0 %  I-Stat CG4 Lactic Acid, ED     Status: Abnormal   Collection Time: 12/20/15  9:00 PM  Result Value Ref Range   Lactic Acid, Venous 2.08 (HH) 0.5 - 2.0 mmol/L   Comment NOTIFIED PHYSICIAN   Urinalysis, Routine w reflex microscopic (not at [facility])     Status: Abnormal   Collection Time: 12/20/15 11:53 PM  Result Value Ref Range  Color, Urine YELLOW YELLOW   APPearance CLOUDY (A) CLEAR   Specific Gravity, Urine >1.046 (H) 1.005 - 1.030   pH 6.0 5.0 - 8.0   Glucose, UA >1000 (A) NEGATIVE mg/dL   Hgb urine dipstick NEGATIVE NEGATIVE   Bilirubin Urine NEGATIVE NEGATIVE   Ketones, ur NEGATIVE NEGATIVE mg/dL   Protein, ur NEGATIVE NEGATIVE mg/dL   Nitrite NEGATIVE NEGATIVE   Leukocytes, UA NEGATIVE NEGATIVE  Urine microscopic-add on     Status: Abnormal   Collection Time: 12/20/15 11:53 PM  Result Value Ref Range   Squamous Epithelial / LPF 0-5 (A) NONE SEEN   WBC, UA NONE SEEN 0 - 5 WBC/hpf   RBC / HPF NONE SEEN 0 - 5 RBC/hpf   Bacteria, UA NONE SEEN NONE SEEN  CBC WITH DIFFERENTIAL     Status: Abnormal   Collection Time: 12/20/15 11:58 PM  Result Value Ref Range   WBC 9.4 4.0 - 10.5 K/uL   RBC 4.89 3.87 - 5.11 MIL/uL   Hemoglobin 12.6 12.0 - 15.0 g/dL   HCT 38.3 36.0 - 46.0 %   MCV 78.3 78.0 - 100.0 fL   MCH 25.8 (L) 26.0 - 34.0 pg   MCHC 32.9 30.0 - 36.0 g/dL   RDW 13.4 11.5 - 15.5 %   Platelets 251 150 - 400 K/uL   Neutrophils Relative % 57 %   Neutro Abs 5.3 1.7 - 7.7 K/uL   Lymphocytes Relative 33 %   Lymphs Abs 3.1 0.7 - 4.0 K/uL   Monocytes Relative 9 %   Monocytes Absolute 0.9 0.1 - 1.0 K/uL   Eosinophils Relative 1 %   Eosinophils Absolute 0.1 0.0 - 0.7 K/uL   Basophils Relative 0 %   Basophils Absolute 0.0 0.0 - 0.1 K/uL  CBG monitoring, ED     Status: Abnormal   Collection Time: 12/20/15 11:59 PM  Result Value Ref Range    Glucose-Capillary 229 (H) 65 - 99 mg/dL  I-Stat CG4 Lactic Acid, ED  (not at  [facility])     Status: None   Collection Time: 12/21/15 12:08 AM  Result Value Ref Range   Lactic Acid, Venous 1.64 0.5 - 2.0 mmol/L  Glucose, capillary     Status: Abnormal   Collection Time: 12/21/15  1:53 AM  Result Value Ref Range   Glucose-Capillary 307 (H) 65 - 99 mg/dL  Comprehensive metabolic panel     Status: Abnormal   Collection Time: 12/21/15  4:05 AM  Result Value Ref Range   Sodium 140 135 - 145 mmol/L   Potassium 3.3 (L) 3.5 - 5.1 mmol/L   Chloride 104 101 - 111 mmol/L   CO2 28 22 - 32 mmol/L   Glucose, Bld 177 (H) 65 - 99 mg/dL   BUN 5 (L) 6 - 20 mg/dL   Creatinine, Ser 0.61 0.44 - 1.00 mg/dL   Calcium 8.7 (L) 8.9 - 10.3 mg/dL   Total Protein 6.4 (L) 6.5 - 8.1 g/dL   Albumin 3.5 3.5 - 5.0 g/dL   AST 16 15 - 41 U/L   ALT 13 (L) 14 - 54 U/L   Alkaline Phosphatase 89 38 - 126 U/L   Total Bilirubin 0.3 0.3 - 1.2 mg/dL   GFR calc non Af Amer >60 >60 mL/min   GFR calc Af Amer >60 >60 mL/min    Comment: (NOTE) The eGFR has been calculated using the CKD EPI equation. This calculation has not been validated in all clinical situations. eGFR's persistently <60  mL/min signify possible Chronic Kidney Disease.    Anion gap 8 5 - 15  CBC WITH DIFFERENTIAL     Status: Abnormal   Collection Time: 12/21/15  4:05 AM  Result Value Ref Range   WBC 7.9 4.0 - 10.5 K/uL   RBC 4.41 3.87 - 5.11 MIL/uL   Hemoglobin 11.3 (L) 12.0 - 15.0 g/dL   HCT 35.4 (L) 36.0 - 46.0 %   MCV 80.3 78.0 - 100.0 fL   MCH 25.6 (L) 26.0 - 34.0 pg   MCHC 31.9 30.0 - 36.0 g/dL   RDW 13.5 11.5 - 15.5 %   Platelets 201 150 - 400 K/uL   Neutrophils Relative % 46 %   Neutro Abs 3.7 1.7 - 7.7 K/uL   Lymphocytes Relative 39 %   Lymphs Abs 3.1 0.7 - 4.0 K/uL   Monocytes Relative 12 %   Monocytes Absolute 0.9 0.1 - 1.0 K/uL   Eosinophils Relative 2 %   Eosinophils Absolute 0.2 0.0 - 0.7 K/uL   Basophils Relative 1 %   Basophils  Absolute 0.0 0.0 - 0.1 K/uL  Glucose, capillary     Status: Abnormal   Collection Time: 12/21/15  7:34 AM  Result Value Ref Range   Glucose-Capillary 166 (H) 65 - 99 mg/dL  Magnesium     Status: None   Collection Time: 12/21/15  9:16 AM  Result Value Ref Range   Magnesium 1.7 1.7 - 2.4 mg/dL  Glucose, capillary     Status: Abnormal   Collection Time: 12/21/15 11:40 AM  Result Value Ref Range   Glucose-Capillary 278 (H) 65 - 99 mg/dL   @BRIEFLABTABLE (sdes,specrequest,cult,reptstatus)   ) Recent Results (from the past 720 hour(s))  Culture, blood (routine x 2)     Status: None (Preliminary result)   Collection Time: 12/20/15  7:25 PM  Result Value Ref Range Status   Specimen Description RIGHT ANTECUBITAL  Final   Special Requests BOTTLES DRAWN AEROBIC AND ANAEROBIC 5CC  Final   Culture   Final    NO GROWTH < 24 HOURS Performed at [facility]    Report Status PENDING  Incomplete     Impression/Recommendation  Principal Problem:   Cellulitis of face Active Problems:   Type 1 diabetes mellitus (HCC)   Asthma   Type 1 diabetes mellitus with hyperglycemia, with long-term current use of insulin (HCC)   Telesha Leverett is a 25 y.o. female with  Cellulitis of forehead and orbit after what sounds like furuncle along her hairline near forehead. Lack of response to doxy argues against MRSA, MSSA and for strep. Unfortunately due to her multiple allergies not many great options though FQ such as levaquin could be considered  #1 Orbital cellulitis  --: since she is responding well to zyvox would continue this though can be converted to PO. Ir her insurance will cover it would give her 14 days of pills total  --warm compress to forehead --ensure HIV, HCV screening  She seems to be improving.  I will sign off for now  Please call with further questions.        12/21/2015, 2:45 PM   Thank you so much for this interesting consult  [place] for [place] [phone] (pager) [phone] (office) 12/21/2015, 2:45 PM  [place] 12/21/2015, 2:45 PM

## 2015-12-21 NOTE — Progress Notes (Signed)
PROGRESS NOTE  Molly Benson ZOX:[id number] DOB: 11/10/1990 DOA: 12/20/2015 PCP: Julieanne Manson, MD  Brief History:  25 year old female with a history of diabetes mellitus type 1, PTSD, anxiety presented with increasing erythema and edema about her left face and periorbital area. Approximately 2 weeks prior to admission, the patient noted a "pimple" along her hairline. Since that time, erythema has spread down to her left periorbital area. The patient visited the emergency department on 12/19/2015, and she was discharged with doxycycline and Norco. She stated that the erythema and edema worsened which led her to present again on the evening of 12/20/2015. She has subjective fevers and chills at home and had an episode of emesis. She had a previous   history of soft tissue infection over 10 years ago. In the emergency department, the patient had WBC 10.1 on 12/19/2015. Potassium was 3.3. Otherwise BMP and CBC were unremarkable. CT of the face revealed interval progression of inflammatory changes on the skin and soft tissues of the left forehead and periorbital area. There was no drainable abscess. Assessment/Plan:  Facial cellulitis -Started as what appeared to be a furuncle on her left scalp -Multiple antibiotic allergies -Continue linezolid 12/20/15>>> -12/20/2015 CT face--no drainable abscess -Follow blood cultures obtained emergency department -Lactic acid 2.08--> 1.64  Diabetes mellitus type 1 -Hemoglobin A1c -Continue home dose Lantus and NovoLog sliding scale  Hypokalemia -Replete -Check magnesium  Tobacco abuse -Defers NicoDerm patch presently -Tobacco cessation discussed   Disposition Plan:   Home in 12/22/15 if stable  Family Communication:  No  Family at beside  Consultants:  none  Code Status:  FULL    Subjective:  Patient states that the face is feeling a little better than yesterday. Denies any nausea, vomiting, diarrhea, vomiting, dysuria, hematuria.  Denies any chest pain or short of breath. Denies any visual disturbance  Objective: Filed Vitals:   12/20/15 2208 12/21/15 0025 12/21/15 0125 12/21/15 0518  BP: 106/80 106/80 102/62 89/56  Pulse: 76 76 82 82  Temp:  98.6 F (37 C) 98.2 F (36.8 C) 98.3 F (36.8 C)  TempSrc:  Oral Oral Oral  Resp: 18 18 16 14   Height:  5\' 1"  (1.549 m)    Weight:  58.968 kg (130 lb)    SpO2: 99% 99% 100% 99%    Intake/Output Summary (Last 24 hours) at 12/21/15 0841 Last data filed at 12/21/15 0519  Gross per 24 hour  Intake      0 ml  Output    125 ml  Net   -125 ml   Weight change:  Exam:   General:  Pt is alert, follows commands appropriately, not in acute distress  HEENT: No icterus, No thrush, No neck mass, Mild left periorbital swelling with some mild erythema. No crepitance or draining wounds. There is a furuncle in the left scalp area with dried blood.   Cardiovascular: RRR, S1/S2, no rubs, no gallops  Respiratory: CTA bilaterally, no wheezing, no crackles, no rhonchi  Abdomen: Soft/+BS, non tender, non distended, no guarding  Extremities: No edema, No lymphangitis, No petechiae, No rashes, no synovitis   Data Reviewed: I have personally reviewed following labs and imaging studies Basic Metabolic Panel:  Recent Labs Lab 12/19/15 1811 12/20/15 2058 12/21/15 0405  NA 136 138 140  K 3.7 3.8 3.3*  CL 103 99* 104  CO2 26  --  28  GLUCOSE 101* 328* 177*  BUN 11 <3* 5*  CREATININE 0.59 0.50  0.61  CALCIUM 9.4  --  8.7*   Liver Function Tests:  Recent Labs Lab 12/19/15 1811 12/21/15 0405  AST 14* 16  ALT 17 13*  ALKPHOS 98 89  BILITOT 0.3 0.3  PROT 7.8 6.4*  ALBUMIN 4.4 3.5    Recent Labs Lab 12/19/15 1811  LIPASE 17   No results for input(s): AMMONIA in the last 168 hours. Coagulation Profile: No results for input(s): INR, PROTIME in the last 168 hours. CBC:  Recent Labs Lab 12/19/15 1811 12/20/15 1929 12/20/15 2058 12/20/15 2358 12/21/15 0405    WBC 10.1 9.1  --  9.4 7.9  NEUTROABS 6.8  --   --  5.3 3.7  HGB 13.4 13.7 12.9 12.6 11.3*  HCT 41.3 42.2 38.0 38.3 35.4*  MCV 77.9* 79.2  --  78.3 80.3  PLT 284 239  --  251 201   Cardiac Enzymes: No results for input(s): CKTOTAL, CKMB, CKMBINDEX, TROPONINI in the last 168 hours. BNP: Invalid input(s): POCBNP CBG:  Recent Labs Lab 12/19/15 1758 12/19/15 2046 12/20/15 2359 12/21/15 0153 12/21/15 0734  GLUCAP 107* 73 229* 307* 166*   HbA1C: No results for input(s): HGBA1C in the last 72 hours. Urine analysis:    Component Value Date/Time   COLORURINE YELLOW 12/20/2015 2353   APPEARANCEUR CLOUDY* 12/20/2015 2353   LABSPEC >1.046* 12/20/2015 2353   PHURINE 6.0 12/20/2015 2353   GLUCOSEU >1000* 12/20/2015 2353   HGBUR NEGATIVE 12/20/2015 2353   BILIRUBINUR NEGATIVE 12/20/2015 2353   KETONESUR NEGATIVE 12/20/2015 2353   PROTEINUR NEGATIVE 12/20/2015 2353   UROBILINOGEN 1.0 05/12/2015 1155   NITRITE NEGATIVE 12/20/2015 2353   LEUKOCYTESUR NEGATIVE 12/20/2015 2353   Sepsis Labs: (procalcitonin:4,lacticidven:4) )No results found for this or any previous visit (from the past 240 hour(s)).   Scheduled Meds: . enoxaparin (LOVENOX) injection  40 mg Subcutaneous QHS  . insulin aspart  0-15 Units Subcutaneous TID WC  . insulin glargine  15 Units Subcutaneous BID  . linezolid (ZYVOX) IV  600 mg Intravenous Q12H  . multivitamin with minerals  1 tablet Oral Daily  . pantoprazole  40 mg Oral Daily   Continuous Infusions: . 0.9 % NaCl with KCl 20 mEq / L 100 mL/hr at 12/21/15 0200    Procedures/Studies: Dg Chest 2 View  12/08/2015  CLINICAL DATA:  Chest pain and cough EXAM: CHEST  2 VIEW COMPARISON:  November 17, 2015 FINDINGS: Lungs are clear. Heart size and pulmonary vascularity are normal. No adenopathy. No pneumothorax. No bone lesions. IMPRESSION: No edema or consolidation. Electronically Signed   By: Bretta Bang III M.D.   On: 12/08/2015 15:31   Ct Head W  Contrast  12/19/2015  CLINICAL DATA:  Initial valuation for acute scalp swelling.  Pain. EXAM: CT HEAD WITH CONTRAST TECHNIQUE: Contiguous axial images were obtained from the base of the skull through the vertex with intravenous contrast. CONTRAST:  80mL ISOVUE-300 IOPAMIDOL (ISOVUE-300) INJECTION 61% COMPARISON:  None. FINDINGS: There is no acute intracranial hemorrhage or infarct. No mass lesion or midline shift. Gray-white matter differentiation is well maintained. Ventricles are normal in size without evidence of hydrocephalus. CSF containing spaces are within normal limits. No extra-axial fluid collection. The calvarium is intact. Orbital soft tissues are within normal limits. The paranasal sinuses and mastoid air cells are well pneumatized and free of fluid. Soft tissue swelling present at the left frontal scalp. There is a small more focal phlegmonous hypodensity measuring approximately 7 mm just deep to the skin (series 4, image  43). This is suspicious for phlegmon/ early abscess. No frank rim enhancing or drainable fluid collection identified. Scalp soft tissues otherwise unremarkable. IMPRESSION: 1. Soft tissue swelling involving the left frontal scalp, suspicious for cellulitis. A superimposed more focal 7 mm hypodensity within the subcutaneous fat of the left frontal scalp with overlying skin thickening is suspicious for phlegmon/early abscess. No other frank rim enhancing or drainable fluid collection identified. 2. No acute intracranial process identified. Electronically Signed   By: Rise MuBenjamin  McClintock M.D.   On: 12/19/2015 21:39   Ct Maxillofacial W/cm  12/20/2015  CLINICAL DATA:  25 year old female with swelling in the left highly. EXAM: CT MAXILLOFACIAL WITH CONTRAST TECHNIQUE: Multidetector CT imaging of the maxillofacial structures was performed with intravenous contrast. Multiplanar CT image reconstructions were also generated. A small metallic BB was placed on the right temple in order to  reliably differentiate right from left. CONTRAST:  75mL ISOVUE-300 IOPAMIDOL (ISOVUE-300) INJECTION 61% COMPARISON:  CT dated 12/19/2015 FINDINGS: There is diffuse soft tissue swelling of the left periorbital region and left forehead area as well as soft tissue swelling of the nasal bridge. There has been interval increase in the soft tissue swelling since the prior study. No definite drainable fluid collection or abscess identified. There is no acute fracture or dislocation. The maxilla, mandible, and pterygoid plates are intact. The globes, retro-orbital fat, and orbital walls are preserved. The visualized paranasal sinuses and mastoid air cells are clear. The visualized portions of the brain appear unremarkable. IMPRESSION: Interval progression of the inflammatory changes of the skin and subcutaneous soft tissues of the left forehead, and left periorbital region compatible with cellulitis. No drainable fluid collection/abscess. Electronically Signed   By: Elgie CollardArash  Radparvar M.D.   On: 12/20/2015 22:04    Glennette Galster, DO  [facility] Pager [phone]  If 7PM-7AM, please contact night-coverage [email or web address] Password [email or web address] 12/21/2015, 8:41 AM   LOS: 1 day

## 2015-12-21 NOTE — ED Notes (Signed)
Room 12 timer started at 12:40pm -

## 2015-12-22 ENCOUNTER — Telehealth: Payer: Self-pay

## 2015-12-22 DIAGNOSIS — J4520 Mild intermittent asthma, uncomplicated: Secondary | ICD-10-CM

## 2015-12-22 DIAGNOSIS — K219 Gastro-esophageal reflux disease without esophagitis: Secondary | ICD-10-CM | POA: Insufficient documentation

## 2015-12-22 DIAGNOSIS — H05012 Cellulitis of left orbit: Principal | ICD-10-CM

## 2015-12-22 DIAGNOSIS — Z720 Tobacco use: Secondary | ICD-10-CM | POA: Insufficient documentation

## 2015-12-22 LAB — CBC WITH DIFFERENTIAL/PLATELET
BASOS PCT: 0 %
Basophils Absolute: 0 10*3/uL (ref 0.0–0.1)
Eosinophils Absolute: 0.1 10*3/uL (ref 0.0–0.7)
Eosinophils Relative: 2 %
HEMATOCRIT: 33.6 % — AB (ref 36.0–46.0)
HEMOGLOBIN: 10.6 g/dL — AB (ref 12.0–15.0)
Lymphocytes Relative: 46 %
Lymphs Abs: 3.1 10*3/uL (ref 0.7–4.0)
MCH: 25.5 pg — AB (ref 26.0–34.0)
MCHC: 31.5 g/dL (ref 30.0–36.0)
MCV: 81 fL (ref 78.0–100.0)
Monocytes Absolute: 0.7 10*3/uL (ref 0.1–1.0)
Monocytes Relative: 11 %
NEUTROS ABS: 2.7 10*3/uL (ref 1.7–7.7)
NEUTROS PCT: 41 %
Platelets: 186 10*3/uL (ref 150–400)
RBC: 4.15 MIL/uL (ref 3.87–5.11)
RDW: 13.6 % (ref 11.5–15.5)
WBC: 6.7 10*3/uL (ref 4.0–10.5)

## 2015-12-22 LAB — URINE CULTURE: Culture: NO GROWTH

## 2015-12-22 LAB — BASIC METABOLIC PANEL
Anion gap: 5 (ref 5–15)
BUN: 14 mg/dL (ref 6–20)
CALCIUM: 8.7 mg/dL — AB (ref 8.9–10.3)
CHLORIDE: 105 mmol/L (ref 101–111)
CO2: 29 mmol/L (ref 22–32)
CREATININE: 0.78 mg/dL (ref 0.44–1.00)
GFR calc non Af Amer: >60 mL/min (ref >60)
Glucose, Bld: 114 mg/dL — ABNORMAL HIGH (ref 65–99)
Potassium: 4.2 mmol/L (ref 3.5–5.1)
SODIUM: 139 mmol/L (ref 135–145)

## 2015-12-22 LAB — HIV ANTIBODY (ROUTINE TESTING W REFLEX): HIV Screen 4th Generation wRfx: NONREACTIVE

## 2015-12-22 LAB — GLUCOSE, CAPILLARY
GLUCOSE-CAPILLARY: 212 mg/dL — AB (ref 65–99)
Glucose-Capillary: 251 mg/dL — ABNORMAL HIGH (ref 65–99)

## 2015-12-22 LAB — HEMOGLOBIN A1C
Hgb A1c MFr Bld: 11.3 % — ABNORMAL HIGH (ref 4.8–5.6)
Mean Plasma Glucose: 278 mg/dL

## 2015-12-22 MED ORDER — HYDROCODONE-ACETAMINOPHEN 5-325 MG PO TABS
2.0000 | ORAL_TABLET | Freq: Four times a day (QID) | ORAL | Status: AC | PRN
Start: 2015-12-22 — End: ?

## 2015-12-22 MED ORDER — NICOTINE 14 MG/24HR TD PT24: 14.0000 mg | MEDICATED_PATCH | Freq: Every day | TRANSDERMAL | Status: AC | PRN

## 2015-12-22 MED ORDER — LINEZOLID 600 MG PO TABS
600.0000 mg | ORAL_TABLET | Freq: Two times a day (BID) | ORAL | Status: AC
Start: 2015-12-22 — End: 2016-01-04

## 2015-12-22 MED ORDER — IBUPROFEN 600 MG PO TABS
600.0000 mg | ORAL_TABLET | Freq: Three times a day (TID) | ORAL | Status: AC | PRN
Start: 2015-12-22 — End: ?

## 2015-12-22 MED ORDER — INSULIN GLARGINE 100 UNIT/ML ~~LOC~~ SOLN: 18.0000 [IU] | Freq: Two times a day (BID) | SUBCUTANEOUS | Status: AC

## 2015-12-22 MED ORDER — PANTOPRAZOLE SODIUM 40 MG PO TBEC: 40.0000 mg | DELAYED_RELEASE_TABLET | Freq: Every day | ORAL | Status: AC

## 2015-12-22 NOTE — Discharge Summary (Signed)
Physician Discharge Summary  Molly Benson [id number] DOB: 1991/02/14 DOA: 12/20/2015  PCP: Julieanne MansonMULBERRY,ELIZABETH, MD  Admit date: 12/20/2015 Discharge date: 12/22/2015  Time spent: 35 minutes  Recommendations for Outpatient Follow-up:  Repeat BMET to follow electrolytes and renal function Please repeat CBC to follow WBC's and Hgb stability  Close follow up on her diabetes (A1C elevated and will need further adjustments on hypoglycemic regimen)   Discharge Diagnoses:     Left Cellulitis of face   Orbital cellulitis   Asthma   Type 1 diabetes mellitus with hyperglycemia, with long-term current use of insulin (HCC)   Multiple allergies   Tobacco abuse   Esophageal reflux   Discharge Condition: stable and improved. Discharge home with instructions to follow up with PCP in 2 weeks.   Diet recommendation: modified carb diet   Filed Weights   12/21/15 0025  Weight: 58.968 kg (130 lb)    History of present illness:  As per H&P written by Dr. Robb Matarrtiz on 12/20/15 25 y.o. female with medical history significant of type 1 diabetes, asthma as a child and teenager, anxiety, PTSD who returns to the emergency department after presenting yesterday with left periorbital tenderness with surrounding erythema and edema that did not respond to outpatient therapy. Per patient, about 2 weeks ago, she noticed "a pimple" in her left frontal hairline area. She states, that she usually gets these on her skin often and they usually resolve spontaneously without any significant symptoms. However, over the past few days, the patient states that she has been having night sweats, chills, fatigue and malaise. She has had subjective fever. She came yesterday to the emergency department and was discharged with prescriptions of doxycycline and 5/325 Norco as needed for pain. However, she states that when she got home, she vomited twice, has not felt well today and when she woke up earlier in the day, she noticed that her  left-side of her face was significantly more painful, swollen and more erythematous. She was unable to open her left eye.   Hospital Course:  Facial cellulitis -Started as what appeared to be a furuncle on her left scalp; spread and affect orbit. Now improving/resolving. -advise to apply warm compresses at least TID, to continue assisting with swelling. -Patient with Multiple antibiotic allergies -Continue linezolid 12/20/15>>> with intention to treat for 13 more days to complete antibiotic therapy -12/20/2015 CT face--no drainable abscess -Follow blood cultures has remained negative up to discharge; final results pending -Lactic acid resolved at discharge (2.08-->>> 1.64)  Diabetes mellitus type 1, uncontrolled and with hyperglycemia - A1c 11.3 -Continue Lantus (but dose adjusted to 18 units BID) and continue NovoLog sliding scale -advise to follow modified carbohydrates diet and encourage not to skip meals   Hypokalemia -Repleted -electrolytes WNL at discharge  Tobacco abuse -Tobacco cessation counseling provided -prescription for nicotine patch given at discharge  GERD -will discharge on PPI  Procedures:  See below for x-ray reports   Consultations:  ID (Dr. Daiva EvesVan Dam)  Discharge Exam: Filed Vitals:   12/21/15 2152 12/22/15 0545  BP: 99/62 104/67  Pulse: 65 85  Temp: 100.6 F (38.1 C) 98.1 F (36.7 C)  Resp: 16 16    General: Pt is alert, follows commands appropriately, not in acute distress  HEENT: No icterus, No Nystagmus, No thrush, No neck masses, Very Mild left periorbital swelling with resolution of erythema. No crepitance or draining wounds. There is a furuncle in the left scalp area with dried blood; no drainage or pain on palpation.  Cardiovascular: RRR, S1/S2, no rubs, no gallops; no JVD  Respiratory: CTA bilaterally, no wheezing, no crackles, no rhonchi and with good O2 sat on RA  Abdomen: Soft/+BS, non tender, non distended, no  guarding  Extremities: No edema, No lymphangitis, No petechiae, No rashes, no synovitis    Discharge Instructions   Discharge Instructions    Discharge instructions    Complete by:  As directed   Take medications as prescribed Follow a modified carbohydrates diet Keep yourself well hydrated Please apply warm compresses to affected area at least 3 times a day (5-7 minutes at a time) Follow up with PCP as schedule          Current Discharge Medication List    START taking these medications   Details  linezolid (ZYVOX) 600 MG tablet Take 1 tablet (600 mg total) by mouth every 12 (twelve) hours. Qty: 26 tablet, Refills: 0    nicotine (NICODERM CQ - DOSED IN MG/24 HOURS) 14 mg/24hr patch Place 1 patch (14 mg total) onto the skin daily as needed (For nicotine withdrawal symptoms). Qty: 28 patch, Refills: 0    pantoprazole (PROTONIX) 40 MG tablet Take 1 tablet (40 mg total) by mouth daily. Qty: 30 tablet, Refills: 1      CONTINUE these medications which have CHANGED   Details  HYDROcodone-acetaminophen (NORCO/VICODIN) 5-325 MG tablet Take 2 tablets by mouth every 6 (six) hours as needed for severe pain. Qty: 20 tablet, Refills: 0    ibuprofen (ADVIL,MOTRIN) 600 MG tablet Take 1 tablet (600 mg total) by mouth every 8 (eight) hours as needed for fever, headache or moderate pain. Qty: 30 tablet, Refills: 0    insulin glargine (LANTUS) 100 UNIT/ML injection Inject 0.18 mLs (18 Units total) into the skin 2 (two) times daily.      CONTINUE these medications which have NOT CHANGED   Details  acetaminophen (TYLENOL) 500 MG tablet Take 1,000 mg by mouth every 6 (six) hours as needed for moderate pain or headache.    bismuth subsalicylate (PEPTO BISMOL) 262 MG/15ML suspension Take 30 mLs by mouth every 6 (six) hours as needed for indigestion.    glucose blood test strip Use as instructed Qty: 100 each, Refills: 0    insulin lispro (HUMALOG) 100 UNIT/ML injection Inject 5-12 units  into skin 3 times daily before meals.  Sliding scale = base of 5 units and increase by 1 unit for every 50. (150-200=6 units 200-250= 7 units Qty: 10 mL, Refills: 0    Multiple Vitamin (MULTIVITAMIN WITH MINERALS) TABS tablet Take 1 tablet by mouth daily.      STOP taking these medications     Caffeine-Magnesium Salicylate (DIUREX PO)      doxycycline (VIBRAMYCIN) 100 MG capsule      benzonatate (TESSALON) 100 MG capsule      cetirizine (ZYRTEC ALLERGY) 10 MG tablet        Allergies  Allergen Reactions  . Amoxicillin Anaphylaxis, Swelling and Rash    Has patient had a PCN reaction causing immediate rash, facial/tongue/throat swelling, SOB or lightheadedness with hypotension: Yes Has patient had a PCN reaction causing severe rash involving mucus membranes or skin necrosis: No Has patient had a PCN reaction that required hospitalization Yes Has patient had a PCN reaction occurring within the last 10 years: Yes If all of the above answers are "NO", then may proceed with Cephalosporin use.   Marland Kitchen Cleocin [Clindamycin Hcl] Anaphylaxis and Swelling  . Sulfa Antibiotics Anaphylaxis and Swelling  .  Vancomycin Anaphylaxis and Swelling  . Zithromax [Azithromycin] Anaphylaxis and Swelling   Follow-up Information    Follow up with [facility], [place] On 01/07/2016.   Specialty:  Internal Medicine   Why:  as previously advised/schedule    Contact information:   [address] [place] [address] [phone]       The results of significant diagnostics from this hospitalization (including imaging, microbiology, ancillary and laboratory) are listed below for reference.    Significant Diagnostic Studies: Dg Chest 2 View  12/08/2015  CLINICAL DATA:  Chest pain and cough EXAM: CHEST  2 VIEW COMPARISON:  November 17, 2015 FINDINGS: Lungs are clear. Heart size and pulmonary vascularity are normal. No adenopathy. No pneumothorax. No bone lesions. IMPRESSION: No edema or consolidation.  Electronically Signed   By: Bretta Bang III M.D.   On: 12/08/2015 15:31   Ct Head W Contrast  12/19/2015  CLINICAL DATA:  Initial valuation for acute scalp swelling.  Pain. EXAM: CT HEAD WITH CONTRAST TECHNIQUE: Contiguous axial images were obtained from the base of the skull through the vertex with intravenous contrast. CONTRAST:  80mL ISOVUE-300 IOPAMIDOL (ISOVUE-300) INJECTION 61% COMPARISON:  None. FINDINGS: There is no acute intracranial hemorrhage or infarct. No mass lesion or midline shift. Gray-white matter differentiation is well maintained. Ventricles are normal in size without evidence of hydrocephalus. CSF containing spaces are within normal limits. No extra-axial fluid collection. The calvarium is intact. Orbital soft tissues are within normal limits. The paranasal sinuses and mastoid air cells are well pneumatized and free of fluid. Soft tissue swelling present at the left frontal scalp. There is a small more focal phlegmonous hypodensity measuring approximately 7 mm just deep to the skin (series 4, image 43). This is suspicious for phlegmon/ early abscess. No frank rim enhancing or drainable fluid collection identified. Scalp soft tissues otherwise unremarkable. IMPRESSION: 1. Soft tissue swelling involving the left frontal scalp, suspicious for cellulitis. A superimposed more focal 7 mm hypodensity within the subcutaneous fat of the left frontal scalp with overlying skin thickening is suspicious for phlegmon/early abscess. No other frank rim enhancing or drainable fluid collection identified. 2. No acute intracranial process identified. Electronically Signed   By: Rise Mu M.D.   On: 12/19/2015 21:39   Ct Maxillofacial W/cm  12/20/2015  CLINICAL DATA:  25 year old female with swelling in the left highly. EXAM: CT MAXILLOFACIAL WITH CONTRAST TECHNIQUE: Multidetector CT imaging of the maxillofacial structures was performed with intravenous contrast. Multiplanar CT image  reconstructions were also generated. A small metallic BB was placed on the right temple in order to reliably differentiate right from left. CONTRAST:  75mL ISOVUE-300 IOPAMIDOL (ISOVUE-300) INJECTION 61% COMPARISON:  CT dated 12/19/2015 FINDINGS: There is diffuse soft tissue swelling of the left periorbital region and left forehead area as well as soft tissue swelling of the nasal bridge. There has been interval increase in the soft tissue swelling since the prior study. No definite drainable fluid collection or abscess identified. There is no acute fracture or dislocation. The maxilla, mandible, and pterygoid plates are intact. The globes, retro-orbital fat, and orbital walls are preserved. The visualized paranasal sinuses and mastoid air cells are clear. The visualized portions of the brain appear unremarkable. IMPRESSION: Interval progression of the inflammatory changes of the skin and subcutaneous soft tissues of the left forehead, and left periorbital region compatible with cellulitis. No drainable fluid collection/abscess. Electronically Signed   By: Elgie Collard M.D.   On: 12/20/2015 22:04    Microbiology: Recent  Results (from the past 240 hour(s))  Culture, blood (routine x 2)     Status: None (Preliminary result)   Collection Time: 12/20/15  7:25 PM  Result Value Ref Range Status   Specimen Description RIGHT ANTECUBITAL  Final   Special Requests BOTTLES DRAWN AEROBIC AND ANAEROBIC 5CC  Final   Culture   Final    NO GROWTH < 24 HOURS Performed at [facility]    Report Status PENDING  Incomplete     Labs: Basic Metabolic Panel:  Recent Labs Lab 12/19/15 1811 12/20/15 2058 12/21/15 0405 12/21/15 0916 12/22/15 0411  NA 136 138 140  --  139  K 3.7 3.8 3.3*  --  4.2  CL 103 99* 104  --  105  CO2 26  --  28  --  29  GLUCOSE 101* 328* 177*  --  114*  BUN 11 <3* 5*  --  14  CREATININE 0.59 0.50 0.61  --  0.78  CALCIUM 9.4  --  8.7*  --  8.7*  MG  --   --   --  1.7  --     Liver Function Tests:  Recent Labs Lab 12/19/15 1811 12/21/15 0405  AST 14* 16  ALT 17 13*  ALKPHOS 98 89  BILITOT 0.3 0.3  PROT 7.8 6.4*  ALBUMIN 4.4 3.5    Recent Labs Lab 12/19/15 1811  LIPASE 17   CBC:  Recent Labs Lab 12/19/15 1811 12/20/15 1929 12/20/15 2058 12/20/15 2358 12/21/15 0405 12/22/15 0411  WBC 10.1 9.1  --  9.4 7.9 6.7  NEUTROABS 6.8  --   --  5.3 3.7 2.7  HGB 13.4 13.7 12.9 12.6 11.3* 10.6*  HCT 41.3 42.2 38.0 38.3 35.4* 33.6*  MCV 77.9* 79.2  --  78.3 80.3 81.0  PLT 284 239  --  251 201 186   CBG:  Recent Labs Lab 12/21/15 1140 12/21/15 1722 12/21/15 1738 12/21/15 2150 12/22/15 0734  GLUCAP 278* 65 73 408* 251*    Signed:  Vassie Loll MD.  [facility] 12/22/2015, 9:15 AM

## 2015-12-22 NOTE — Discharge Instructions (Signed)

## 2015-12-22 NOTE — Telephone Encounter (Signed)
Call received from Lorenda IshiharaSuzanne Peele, RN CM requesting a hospital follow up appointment [facility].  Informed her that unfortunately there are not any hospital follow up appointments available at this time; but the patient can call the clinic at any time to check for cancellations.

## 2015-12-22 NOTE — Care Management Note (Signed)
Case Management Note  Patient Details  Name: Geraldine SolarLaci Barkett MRN: [id number] Date of Birth: 14-May-1991  Subjective/Objective:     Admitted with Left Cellulitis of face, Orbital cellulitis, failed OP treatment               Action/Plan: Discharge planning, spoke with patient at bedside. States she has received a call from her pharmacy stating Zyvox is covered and she will have a $3 copay. She has a PCP appt in June but attending asked if we could get [facility] until then for more timely f/u. Contacted [facility], for appt.   Expected Discharge Date:                  Expected Discharge Plan:  Home/Self Care  In-House Referral:  NA  Discharge planning Services  NA  Post Acute Care Choice:  NA Choice offered to:  NA  DME Arranged:  N/A DME Agency:  NA  HH Arranged:  NA HH Agency:  NA  Status of Service:  Completed, signed off  Medicare Important Message Given:    Date Medicare IM Given:    Medicare IM give by:    Date Additional Medicare IM Given:    Additional Medicare Important Message give by:     If discussed at Long Length of Stay Meetings, dates discussed:    Additional Comments:  Alexis Goodelleele, Krysten Veronica K, RN 12/22/2015, 9:58 AM

## 2015-12-23 LAB — HCV COMMENT:

## 2015-12-23 LAB — HEPATITIS C ANTIBODY (REFLEX): HCV Ab: 0.1 s/co ratio (ref 0.0–0.9)

## 2015-12-25 LAB — CULTURE, BLOOD (ROUTINE X 2): CULTURE: NO GROWTH

## 2016-01-07 ENCOUNTER — Ambulatory Visit: Payer: Self-pay | Admitting: Internal Medicine

## 2016-05-19 IMAGING — CT CT MAXILLOFACIAL W/ CM
3 series · 16 of 47 positions shown, 19 images · IV contrast (iopamidol)
Comparison: CT dated 12/19/2015

CLINICAL DATA: 25-year-old female with swelling in the left highly.

EXAM:
CT MAXILLOFACIAL WITH CONTRAST
TECHNIQUE: Multidetector CT imaging of the maxillofacial structures was
performed with intravenous contrast. Multiplanar CT image
reconstructions were also generated. A small metallic BB was placed
on the right temple in order to reliably differentiate right from
left.
CONTRAST:  75mL YT2HHO-E33 IOPAMIDOL (YT2HHO-E33) INJECTION 61%

[Series 3: facial st · axial · 0.33mm/px · z∈[+1222,+1360]mm · 10 of 81 slices shown, 13 images]
[im 6/81  brain]
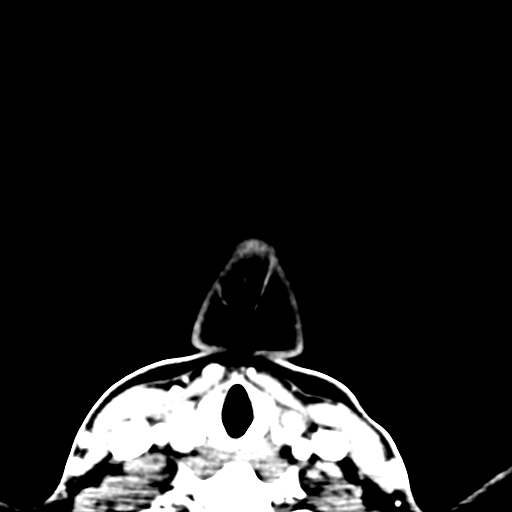
[im 6/81  bone]
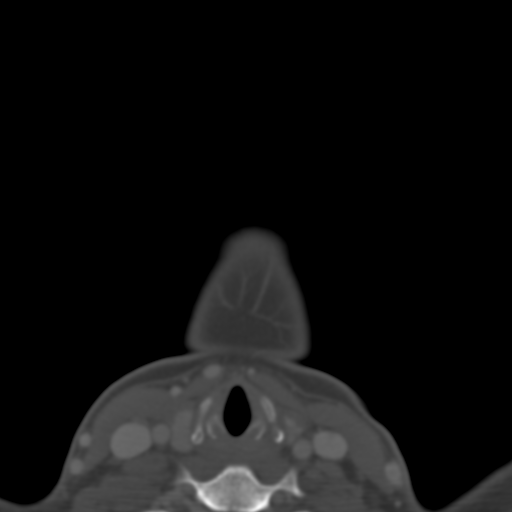
[im 14/81  bone]
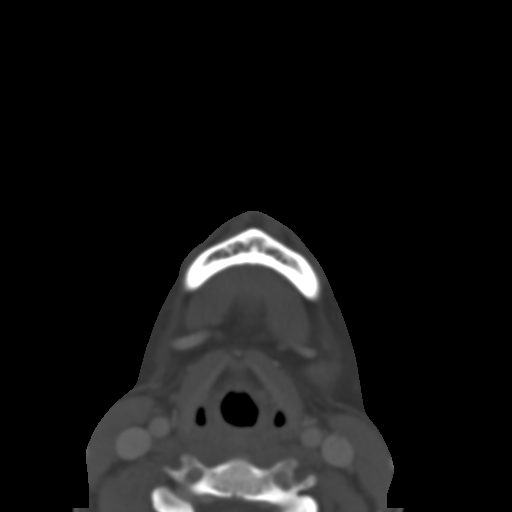
[im 23/81  bone]
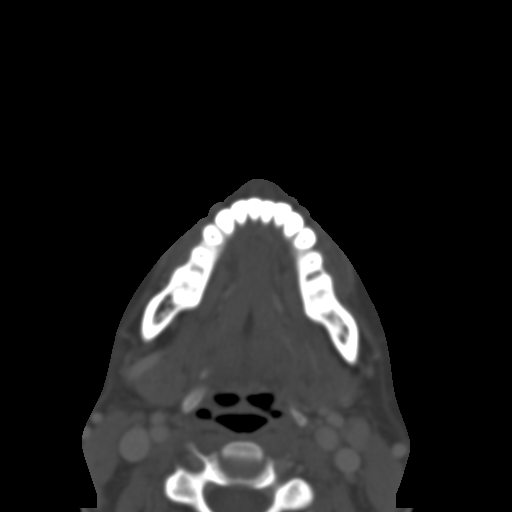
[im 28/81  bone]
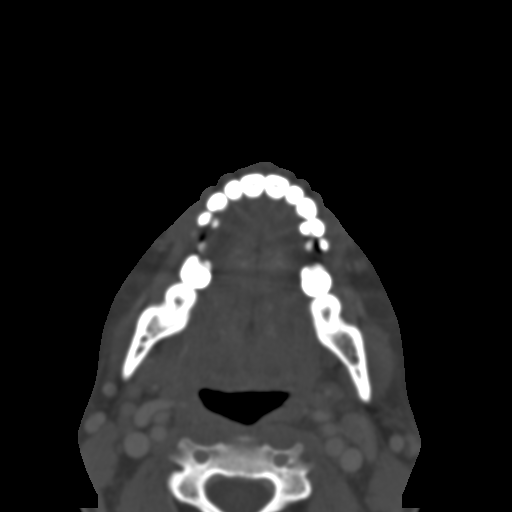
[im 36/81  brain]
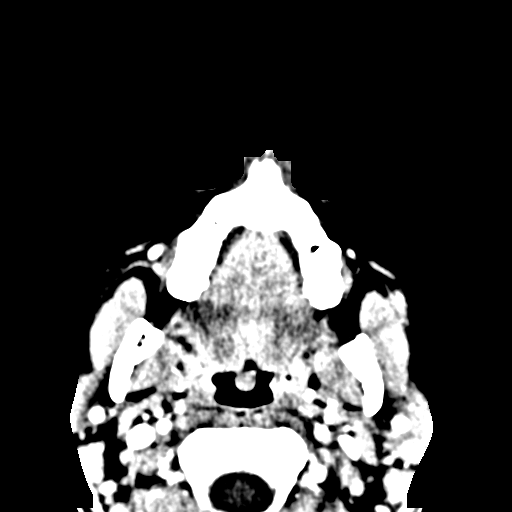
[im 36/81  bone]
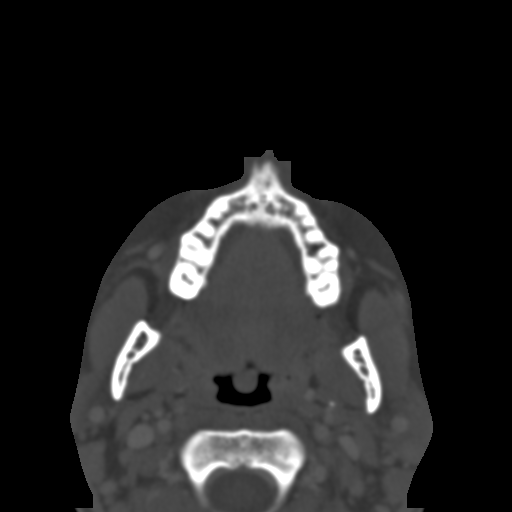
[im 45/81  bone]
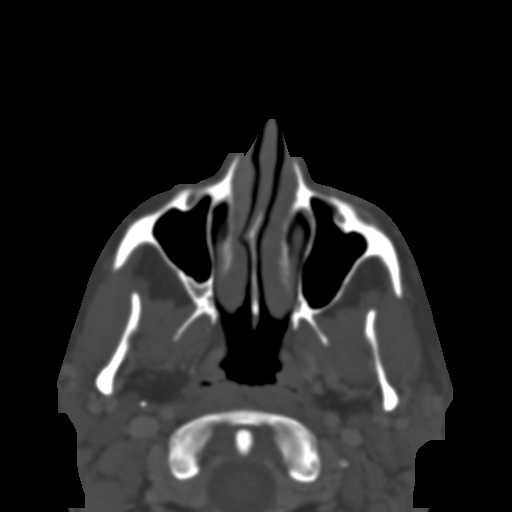
[im 53/81  bone]
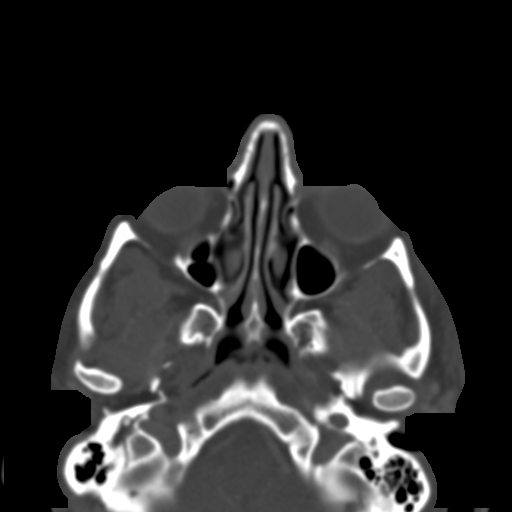
[im 61/81  bone]
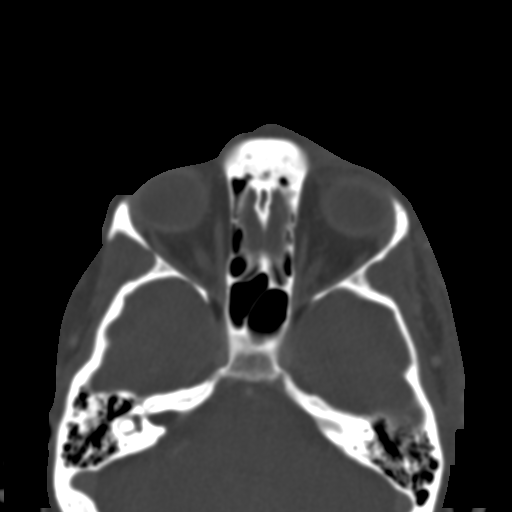
[im 67/81  brain]
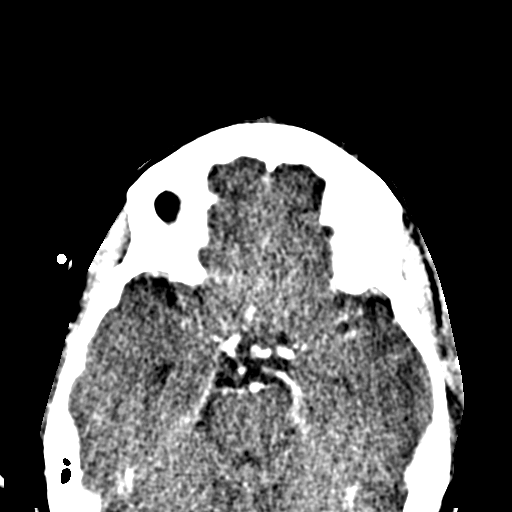
[im 67/81  bone]
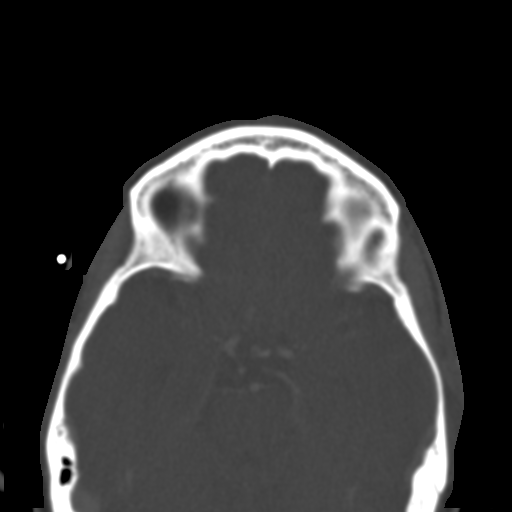
[im 75/81  bone]
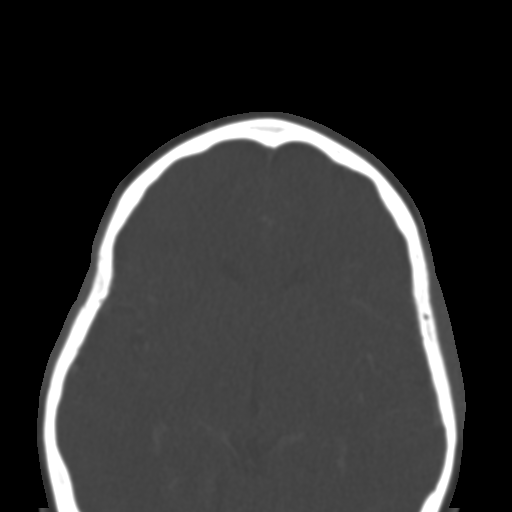

[Series 5: coronal st · coronal · 0.31mm/px · 3 of 64 slices shown]
[im 22/64  bone]
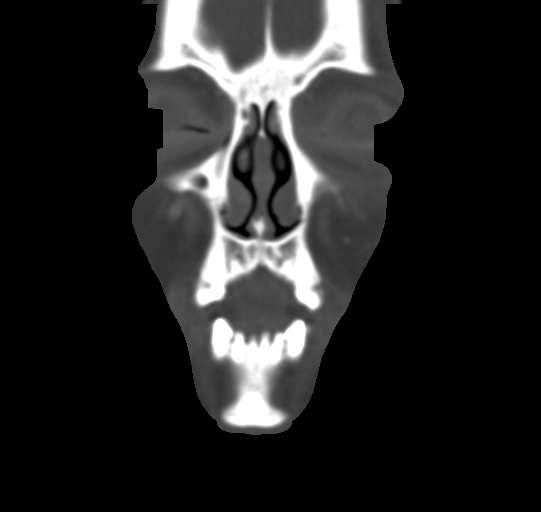
[im 29/64  bone]
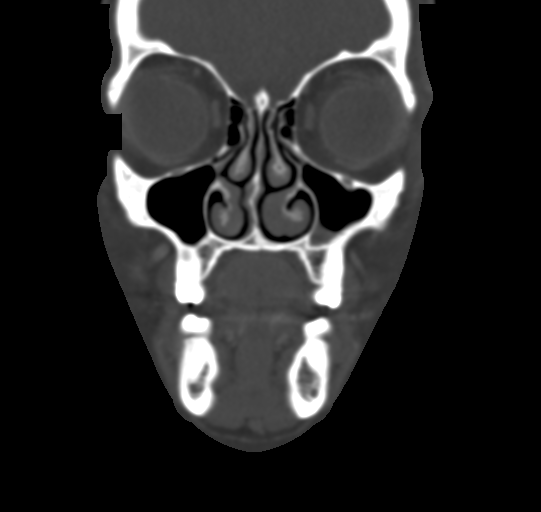
[im 36/64  bone]
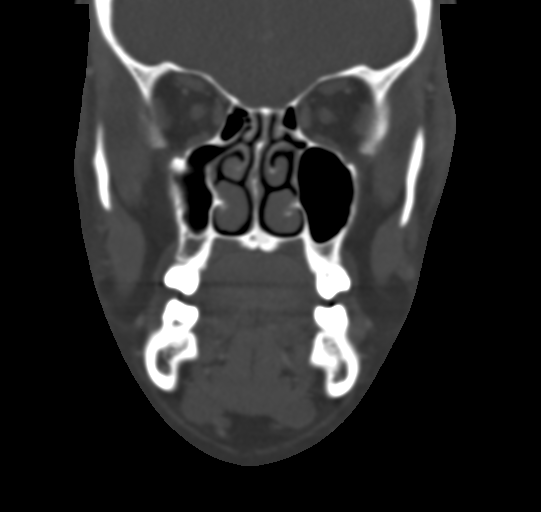

[Series 6: sagittal st · sagittal · 0.31mm/px · 3 of 78 slices shown]
[im 26/78  bone]
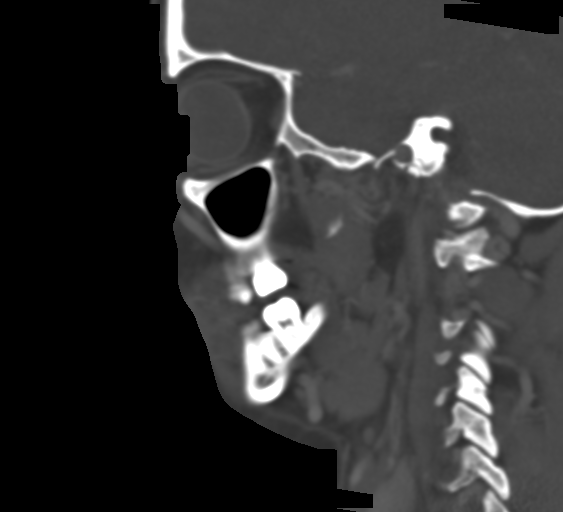
[im 39/78  bone]
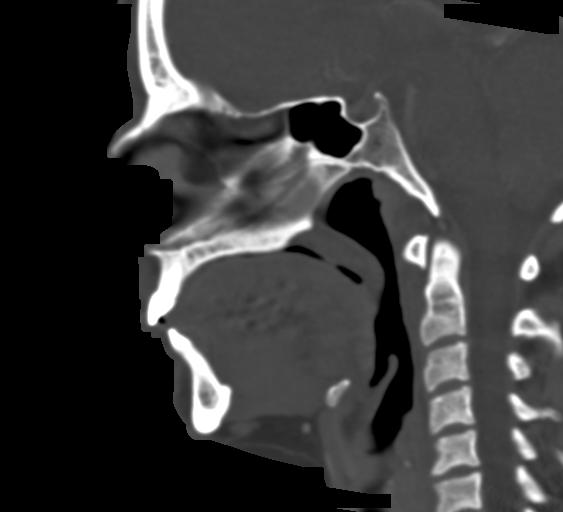
[im 52/78  bone]
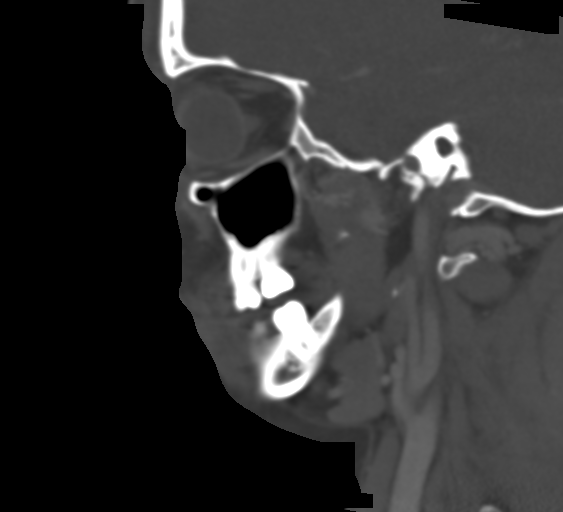

[16 of 47 positions shown; findings below may reference images not displayed]

FINDINGS: There is diffuse soft tissue swelling of the left periorbital region
and left forehead area as well as soft tissue swelling of the nasal
bridge. There has been interval increase in the soft tissue swelling
since the prior study. No definite drainable fluid collection or
abscess identified.

There is no acute fracture or dislocation. The maxilla, mandible,
and pterygoid plates are intact. The globes, retro-orbital fat, and
orbital walls are preserved. The visualized paranasal sinuses and
mastoid air cells are clear. The visualized portions of the brain
appear unremarkable.
IMPRESSION: Interval progression of the inflammatory changes of the skin and
subcutaneous soft tissues of the left forehead, and left periorbital
region compatible with cellulitis. No drainable fluid
collection/abscess.
